# Patient Record
Sex: Male | Born: 1965 | Race: White | Hispanic: No | Marital: Married | Smoking: Current every day smoker
Health system: Southern US, Community
[De-identification: ages and names within clinical notes are randomized; demographics above are authoritative.]

## PROBLEM LIST (undated history)

## (undated) DIAGNOSIS — I10 Essential (primary) hypertension: Secondary | ICD-10-CM

## (undated) DIAGNOSIS — F1010 Alcohol abuse, uncomplicated: Secondary | ICD-10-CM

---

## 2002-04-10 ENCOUNTER — Encounter: Payer: Self-pay | Admitting: Family Medicine

## 2002-04-10 ENCOUNTER — Ambulatory Visit (HOSPITAL_COMMUNITY): Admission: RE | Admit: 2002-04-10 | Discharge: 2002-04-10 | Payer: Self-pay | Admitting: Family Medicine

## 2004-05-05 ENCOUNTER — Ambulatory Visit (HOSPITAL_COMMUNITY): Admission: RE | Admit: 2004-05-05 | Discharge: 2004-05-05 | Payer: Self-pay | Admitting: Internal Medicine

## 2005-11-28 IMAGING — CT CT PELVIS W/ CM
1 of 3 series · 12 of 32 positions shown, 18 images · IV contrast (agent unspecified)
Comparison: None.

CLINICAL DATA: Hematuria, abdominal pain and nausea. 
 ABDOMEN AND PELVIS CT WITH CONTRAST ? 05/05/04:
TECHNIQUE: Contiguous 5 mm axial images were obtained from the lung bases through the pubic symphysis after administration of oral and 022cc of nonionic intravenous contrast.

[Series 3377: — · axial · 0.80mm/px · z∈[+1348,+1768]mm · 12 of 100 slices shown, 18 images]
[im 8/100  soft-tissue]
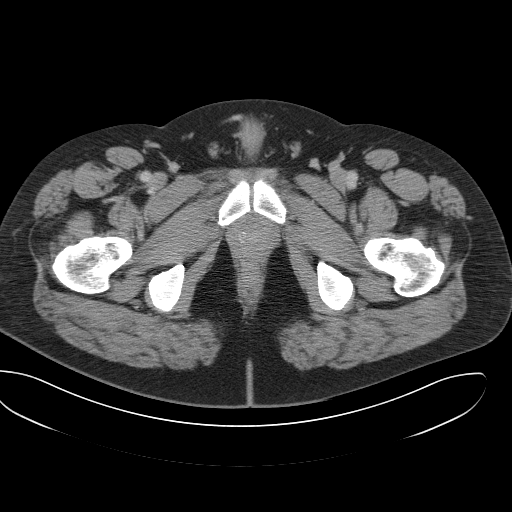
[im 8/100  bone]
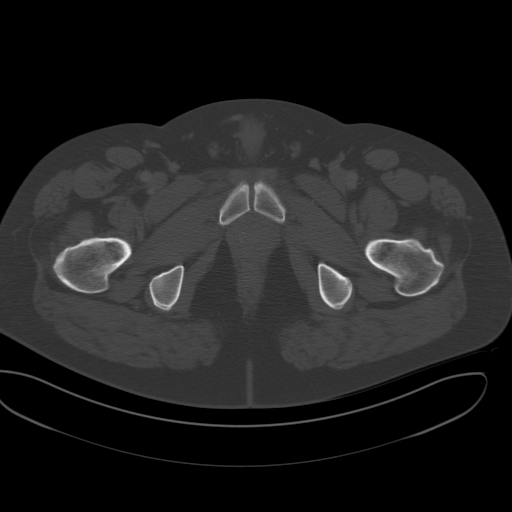
[im 16/100  soft-tissue]
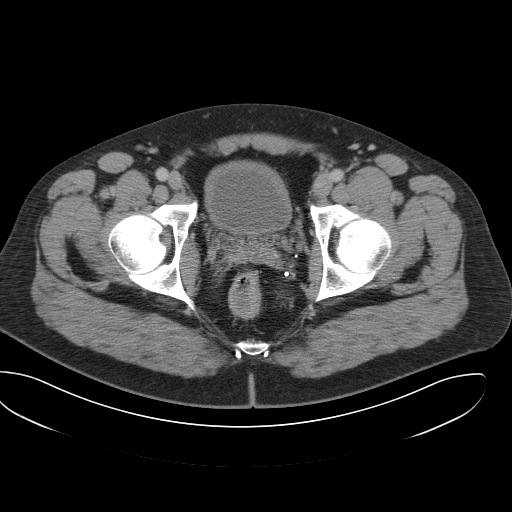
[im 23/100  soft-tissue]
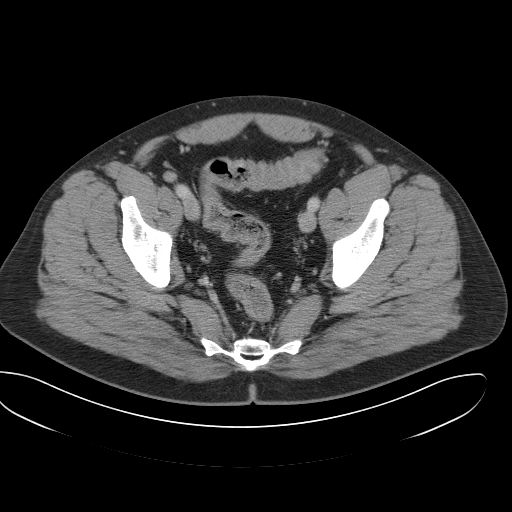
[im 31/100  soft-tissue]
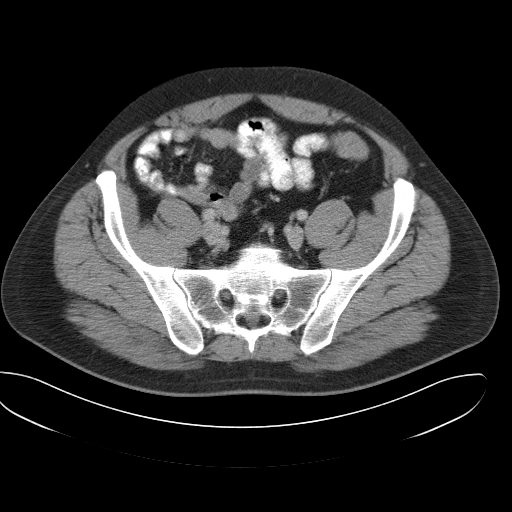
[im 39/100  soft-tissue]
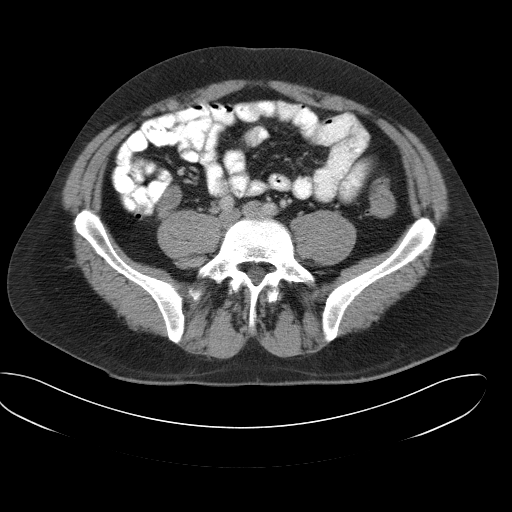
[im 46/100  soft-tissue]
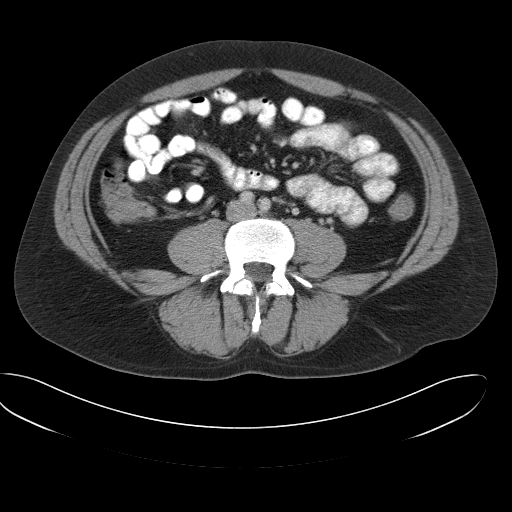
[im 54/100  soft-tissue]
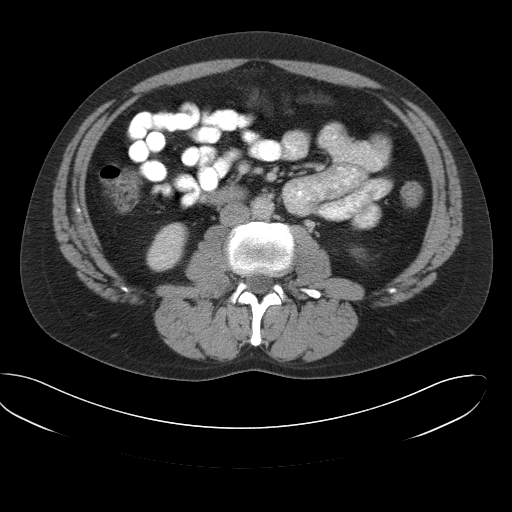
[im 61/100  soft-tissue]
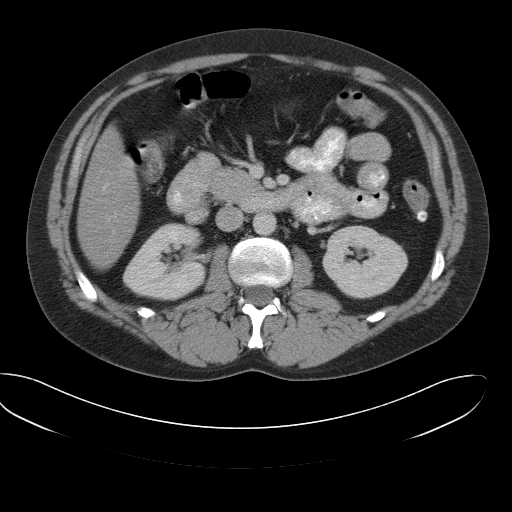
[im 69/100  soft-tissue]
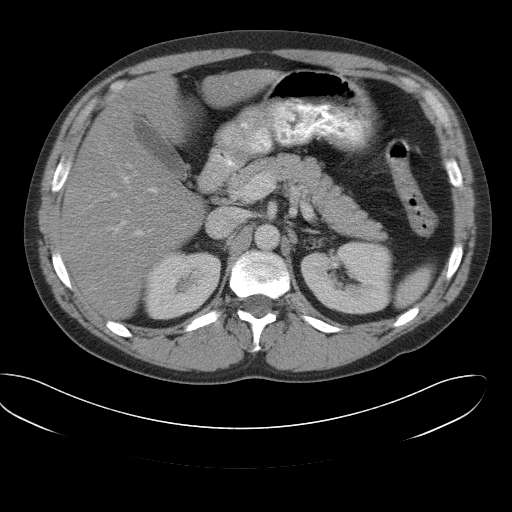
[im 69/100  lung]
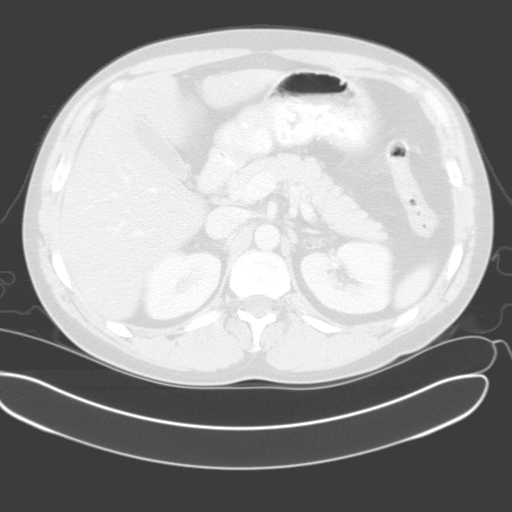
[im 69/100  bone]
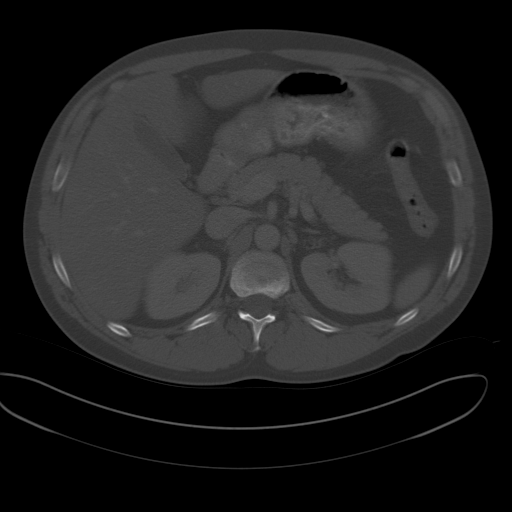
[im 77/100  soft-tissue]
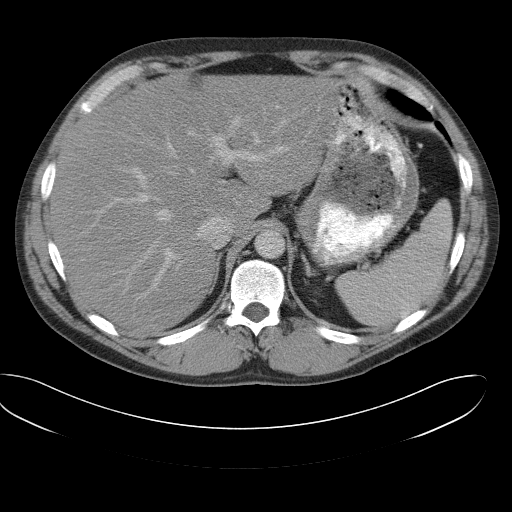
[im 77/100  lung]
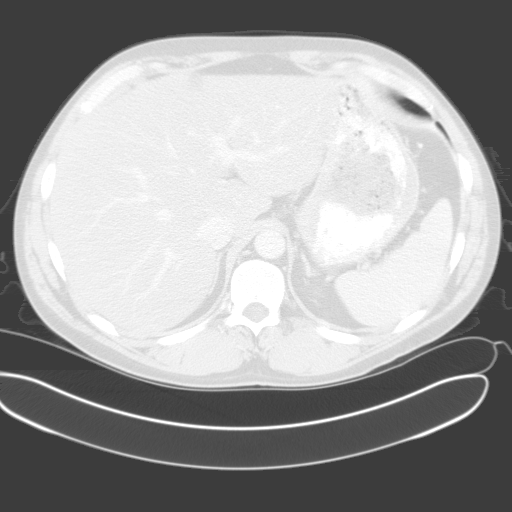
[im 84/100  soft-tissue]
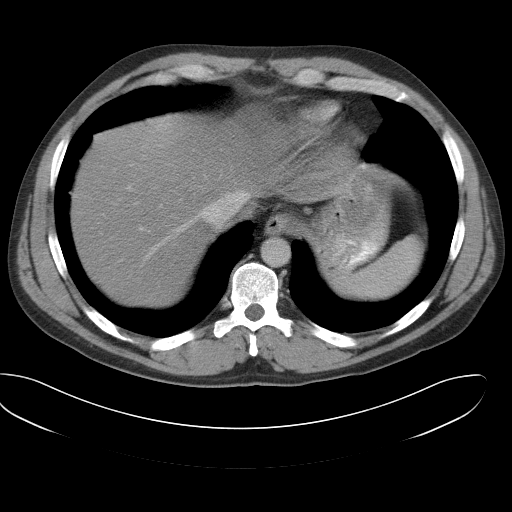
[im 84/100  lung]
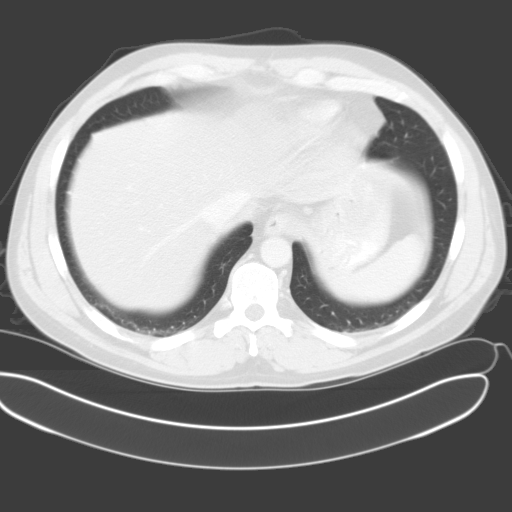
[im 92/100  soft-tissue]
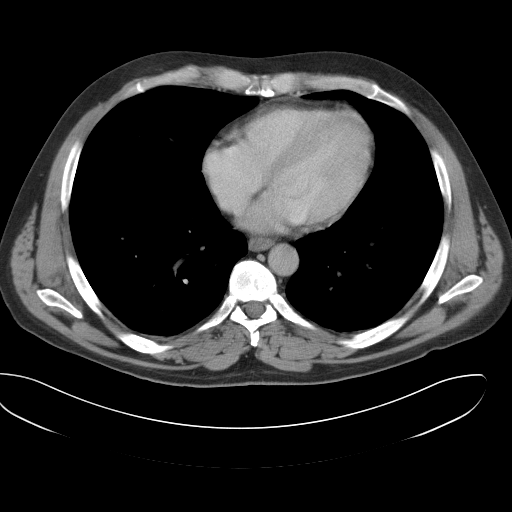
[im 92/100  lung]
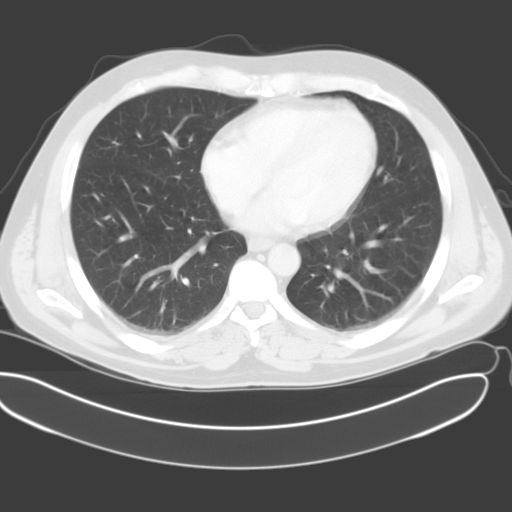

[12 of 32 positions shown; findings below may reference images not displayed]

FINDINGS: The liver is diffusely infiltrated.  The spleen, stomach, duodenum, pancreas, and adrenal glands are unremarkable.  The gallbladder is decompressed.   There are subcentimeter well defined low-density lesions in each kidney, most consistent with cysts.  A 3mm non-obstructing stone is seen in the lower pole of the left kidney. There is no lymphadenopathy or free fluid.
IMPRESSION: Diffuse fatty infiltration of the liver. 
 Tiny bilateral low-density renal lesions most compatible with cysts. 
 3mm non-obstructing lower pole stone in the left kidney. 
 CT PELVIS, WITH CONTRAST:
 Imaging through the anatomic pelvis reveals a normal appendix.  The terminal ileum is unremarkable.  No free fluid or adenopathy.  Phleboliths are seen in the anatomic pelvis bilaterally, but project clear of the distal ureters.  Diverticular change is seen in the sigmoid colon and wall thickening in the sigmoid colon is presumably related to musculature hypertrophy.  There is no pericolonic inflammation to suggest diverticulitis.
IMPRESSION: Normal appendix and terminal ileum. 
 No evidence for distal ureteral stones. 
 Diverticular change in the sigmoid colon without evidence for diverticulitis.

## 2006-01-02 ENCOUNTER — Inpatient Hospital Stay (HOSPITAL_COMMUNITY): Admission: EM | Admit: 2006-01-02 | Discharge: 2006-01-04 | Payer: Self-pay | Admitting: Emergency Medicine

## 2007-06-02 ENCOUNTER — Inpatient Hospital Stay (HOSPITAL_COMMUNITY): Admission: EM | Admit: 2007-06-02 | Discharge: 2007-06-04 | Payer: Self-pay | Admitting: Emergency Medicine

## 2007-08-05 ENCOUNTER — Emergency Department (HOSPITAL_COMMUNITY): Admission: EM | Admit: 2007-08-05 | Discharge: 2007-08-05 | Payer: Self-pay | Admitting: Emergency Medicine

## 2008-12-25 IMAGING — CR DG CHEST 2V
2 series · 2 of 2 positions shown · non-contrast
Comparison: none

CLINICAL DATA: Renal failure.  Hyponatremia.  Question infiltrate.
 CHEST ? 2 VIEW ? 06/02/07:

[w chest pa]
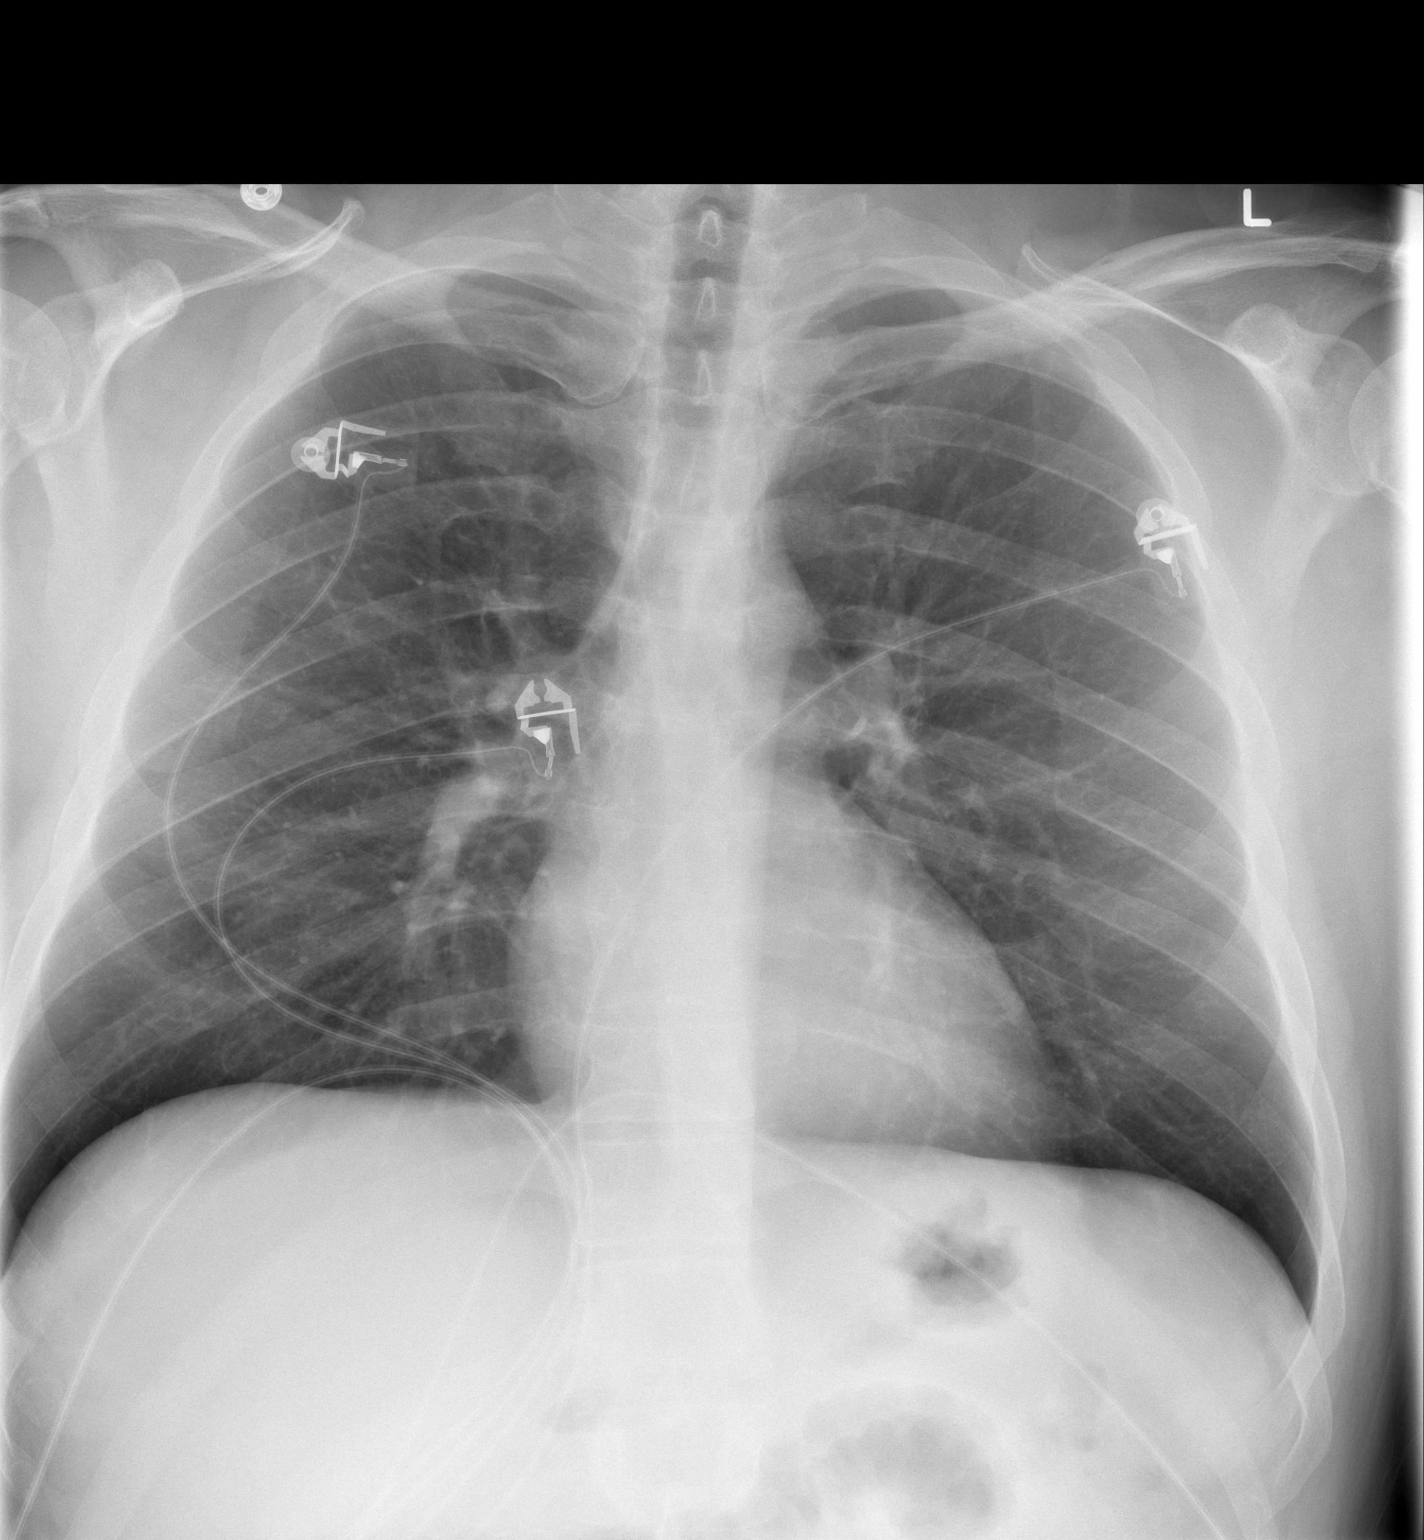

[w chest lat]
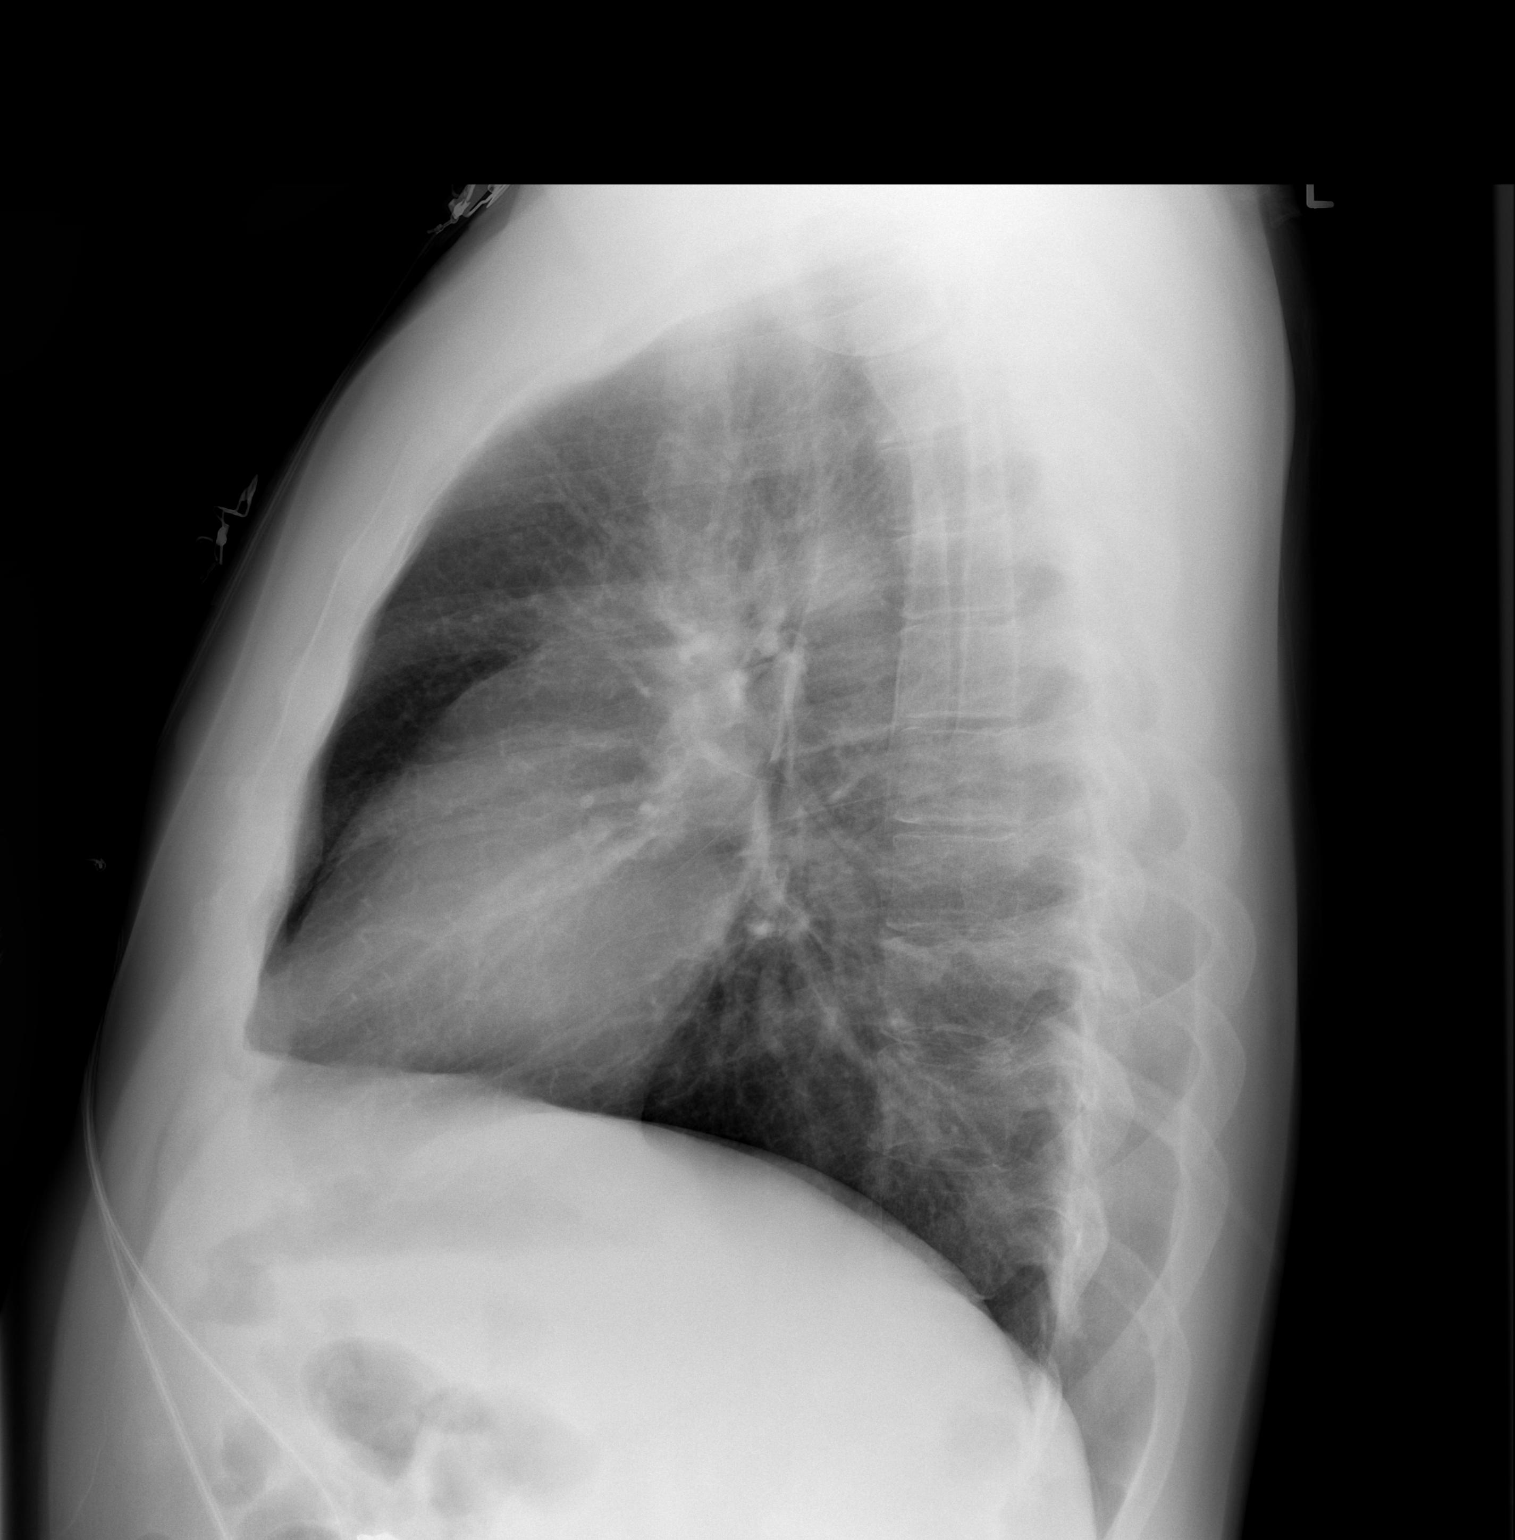

[2 of 2 positions shown; findings below may reference images not displayed]

FINDINGS: The lungs are clear and the heart and mediastinal structures are normal.
IMPRESSION: No evidence for active chest disease.

## 2010-06-17 ENCOUNTER — Emergency Department (HOSPITAL_COMMUNITY)
Admission: EM | Admit: 2010-06-17 | Discharge: 2010-06-17 | Payer: Self-pay | Source: Home / Self Care | Admitting: Family Medicine

## 2010-06-25 ENCOUNTER — Encounter: Payer: Self-pay | Admitting: Family Medicine

## 2010-10-17 NOTE — Consult Note (Signed)
NAME:  EGAN, BERKHEIMER          ACCOUNT NO.:  [id number]   MEDICAL RECORD NO.:  [id number]          PATIENT TYPE:  INP   LOCATION:  4741                         FACILITY:  [facility]   PHYSICIAN:  Antonietta Breach, M.D.  DATE OF BIRTH:  1965-08-08   DATE OF CONSULTATION:  06/03/2007  DATE OF DISCHARGE:                                 CONSULTATION   REQUESTING PHYSICIAN:  [facility]   REASON FOR CONSULTATION:  Alcohol dependence.   HISTORY OF PRESENT ILLNESS:  [name] is a 45 year old  male admitted to the [facility] on June 02, 2007 with  hyponatremia.   [name] describes economic stress.  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  He does have a great deal of  worry as well as feeling on edge.  He consumes a large amount of alcohol  per day.  He estimates that he drinks as much as one-fifth of liquor per  day with a number of beers on top of that and has been doing this for  several weeks.   He denies depressed mood or decreased interests.  He does have  constructive future goals.  He has no thoughts of harming himself or  others.  He has no hallucinations or delusions.  He has had some  difficulty with memory and concentration at times however this has  improved after his general medical support in the hospital.  He is  tolerating the Ativan withdrawal protocol without adverse effects.  He  has no hallucinations or delusions.  He is currently not having any  tremors, sweats or other withdrawal signs.  He is socially appropriate  and cooperative with his bedside healthcare.   PAST PSYCHIATRIC HISTORY:  No history of suicide attempts.  No history  of major depression.  He has no history of formal alcohol  rehabilitation.  He has attended two AA meetings.   He does have a history of a DWI when he was in high school.   He has not been on any psychotropic medication other than his  detoxification.   FAMILY PSYCHIATRIC  HISTORY:  None known.   SOCIAL HISTORY:  [name].  He  has no children.  He does not do any illegal drugs.  Please see the  history of present illness.  He lives with his wife.   PAST MEDICAL HISTORY:  1. Hypertension.  2. Hypothyroidism.  3. History of renal insufficiency.   ALLERGIES:  Codeine.   MEDICATIONS:  MAR is reviewed.  The patient is on the Ativan withdrawal  protocol.   LABORATORY DATA:  Sodium 131, BUN 30, creatinine 1.83, WBC 5.1,  hemoglobin 15.3, platelet count 135, SGOT 157, SGPT 141. Magnesium,  phosphorus and TSH are within normal limits.  Urine drug screen positive  for benzodiazepines.   REVIEW OF SYSTEMS:  CONSTITUTIONAL:  Afebrile.  No weight loss.  HEAD:  No trauma.  EYES:  No visual changes.  EARS:  No hearing impairment.  NOSE:  No rhinorrhea.  THROAT:  No sore throat.  NEUROLOGIC:  No focal  motor  or sensory deficit.  PSYCHIATRIC:  As above.  CARDIOVASCULAR:  No  chest pain or palpitations.  RESPIRATORY:  No coughing or wheezing.  GASTROINTESTINAL:  No nausea, vomiting, diarrhea.  GENITOURINARY:  No  dysuria.  SKIN:  Unremarkable.  ENDOCRINE/METABOLIC:  No heat or cold  intolerance.  MUSCULOSKELETAL:  No deformities.  HEMATOLOGIC/LYMPHATIC:  Unremarkable.   PHYSICAL EXAMINATION:  VITAL SIGNS:  Temperature 97.1, pulse 96,  respiratory rate 20, blood pressure 114/69, O2 saturation on room air  98%.  GENERAL APPEARANCE:  [name] is a middle-aged male sitting up in  a hospital chair with no abnormal involuntary movements.  MENTAL STATUS EXAM:  [name] is alert.  His attention span is  slightly decreased.  His eye contact is good.  He is socially  appropriate and cooperative.  He is oriented to all spheres.  Memory is  intact to immediate, recent and remote.  His affect is slightly anxious.  Mood is mildly anxious.  Fund of knowledge and intelligence within  normal limits.  Speech normal rate and  prosody without dysarthria.  Thought process logical, coherent, goal-directed.  No looseness of  associations.  Language expression and comprehension intact.  Abstraction intact.  Thought content:  No thoughts of harming himself or  others.  No delusions or hallucinations.  Insight is partial.  Judgment  is intact.   ASSESSMENT:  AXIS I:  1.  293.84  Anxiety disorder not otherwise  specified.  1. Alcohol dependence with physiologic dependence.  AXIS II:  Deferred.  AXIS III:  See general medical section.  AXIS IV:  General medical economic occupational.  AXIS V:  55.   John Gay is not at risk to harm himself or others.  He agrees to  call emergency services immediately for any psychiatric emergency  symptoms.   The undersigned provided ego supportive psychotherapy and education.   The patient is motivated for an outpatient alcohol rehabilitation  program.  He emphasizes that he must continue working while he is  attending the program.   RECOMMENDATIONS:  1. Would have the social worker help coordinate with the patient      starting a chemical dependency intensive outpatient program at one      of the hospitals.  Options are [facility], [facility] or      [facility]. The [facility]'s meet at various times which could      be coordinated with the patient's work schedule.  2. 12-step groups.  3. Would continue thiamine, a multivitamin and folate every day      indefinitely.  4. Regarding treatment for anxiety, this will be deferred.  The      patient will benefit from coping skills and stress management      training.  Whether or not he will need SSRI therapy will be      determined in [facility] after he is finished with the [facility] detox      protocol.  He will likely be a candidate eventually for cognitive      behavioral therapy along with deep breathing and progressive muscle      relaxation training.   Once the [facility] IOP is arranged would ask the patient to  release a copy of  this consultation to the mental health professionals in the [facility] IOP.      Antonietta Breach, M.D.  Electronically Signed     JW/MEDQ  D:  06/04/2007  T:  06/04/2007  Job:  [id number]

## 2010-10-17 NOTE — H&P (Signed)
NAME:  John Gay, CAHOON          ACCOUNT NO.:  [id number]   MEDICAL RECORD NO.:  [id number]          PATIENT TYPE:  EMS   LOCATION:  MINO                         FACILITY:  [facility]   PHYSICIAN:  Hettie Holstein, D.O.    DATE OF BIRTH:  21-Apr-1966   DATE OF ADMISSION:  06/02/2007  DATE OF DISCHARGE:                              HISTORY & PHYSICAL   PRIMARY CARE PHYSICIAN:  Dr. Lorenz Coaster as well as Dr. Regino Schultze in  [facility], and Dr. Nobie Putnam.   CHIEF COMPLAINT:  Generalized feeling unwell.   HISTORY OF PRESENT ILLNESS:  Mr. Proto is a pleasant 45 year old  male with known alcoholism who attempted to stop drinking yesterday.  He  had been feeling bad for the past week or so.  His wife states that he  typically does very poorly when he tries to stop drinking and typically  will drink a shot to ease the tremors and shakes.  He presents to the  emergency department tremulous.  His alcohol level is 10.  His last  drink was the day prior to presentation.  His wife brought him to the  emergency department.  Again, his last drink was at 9:30 p.m.  He is  having quite poor oral intake and his creatinine is elevated at 3 in the  emergency department.   PAST MEDICAL HISTORY:  Significant for:  1. Previous history of DTs.  2. History of alcoholism.  3. Tobacco dependence.  4. Hypertension.  5. Hypothyroidism.  6. Anxiety.  7. Reported elevation of LFTs.  Details are not further available at      this time.   HOME MEDICATIONS:  1. Synthroid 200 mcg daily.  2. Lisinopril/hydrochlorothiazide 10/12.5.  3. Toprol XL 25 mg daily.   ALLERGIES:  CODEINE, reaction unknown.   SOCIAL HISTORY:  The patient drinks about a fifth of whiskey plus 3-4  beers per day.  He works as a [employer] manager.  He has no children.  He has been  married almost 16 years.  His mother is present with him at this time,  phone number is [phone].   FAMILY HISTORY:  Mother has a history of deep vein thrombosis and no  other family history is provided.   REVIEW OF SYSTEMS:  He has been having some nausea.  No vomiting, no  chest pain or shortness of breath.  He has been having generalized  tremors.  He has not passed out according to the family as they can  recall.  His wife repots that he does have a tremendous amount of  anxiety with the recent loss of a family member as well as problems with  their animals at home.  In any event, his wife states that very little  is required to cause him to seek alcohol for self treatment.   PHYSICAL EXAMINATION:  VITAL SIGNS:  In the emergency department, his  blood pressure is 101/66, temperature 97.3, heart rate 97, respirations  18, O2 saturation is 99%.  HEENT:  Reveals head to be normocephalic atraumatic.  Extraocular  muscles are intact.  NECK:  Supple and nontender without palpable thyromegaly or mass.  CARDIOVASCULAR:  Reveals a normal S1 and S2.  LUNGS:  Clear bilaterally without dullness to percussion.  He exhibits  normal effort.  ABDOMEN:  Soft and nontender without rebound or guarding.  No rigidity.  No palpable hepatosplenomegaly.  Bowel sounds are normoactive.  LOWER EXTREMITIES:  Reveals no calf tenderness or edema.   LABORATORY DATA:  His WBC is 5.1, hemoglobin 15.3, platelet count 135,  MCV of 98.  Sodium 119, potassium 4.5, BUN 36, creatinine 3, and glucose  of 88.  No imaging studies are available at this time as our imaging  system is currently not accessible.   ASSESSMENT:  1. Alcohol withdrawal.  2. Delirium tremens.  3. Hyponatremia secondary to acute renal insufficiency, volume      depletion, and continued Lisinopril and hydrochlorothiazide.  4. Acute renal failure likely prerenal in addition to continued      antihypertensive medications.  5. Tobacco abuse.  6. Prior history of elevated liver function tests, we do not have this      for review at this time.  7. Hypothyroidism.   PLAN:  At this time Mr. Kohlmann will be  admitted to a telemetry  floor, placed on DT precautions and benzodiazepines and alcohol  withdrawal protocol.  Will follow his clinical course and resolution of  his renal failure and electrolytes abnormalities.  He does express a  desire to quit drinking and he does seem to have quite a strong support  from his mother as well as his wife.      Hettie Holstein, D.O.  Electronically Signed     ESS/MEDQ  D:  06/02/2007  T:  06/02/2007  Job:  [id number]   cc:   Patrica Duel, M.D.

## 2010-10-17 NOTE — Discharge Summary (Signed)
NAME:  John Gay, John Gay          ACCOUNT NO.:  [id number]   MEDICAL RECORD NO.:  [id number]          PATIENT TYPE:  INP   LOCATION:  4741                         FACILITY:  [facility]   PHYSICIAN:  Elliot Cousin, M.D.    DATE OF BIRTH:  11/18/1965   DATE OF ADMISSION:  06/02/2007  DATE OF DISCHARGE:                               DISCHARGE SUMMARY   PRIMARY CARE PHYSICIAN:  Dr. Patrica Duel.   DISCHARGE DIAGNOSES:  1. Alcohol dependence/addiction  2. Alcohol withdrawal syndrome.  3. Acute renal failure secondary to prerenal azotemia.  Resolved prior      to hospital discharge.  4. Hepatic transaminitis/alcoholic hepatitis.  5. Hypothyroidism.  6. Hypertension  7. Tobacco abuse.   DISCHARGE MEDICATIONS:  1. Multivitamin once daily.  2. Toprol XL 50 mg daily.  3. Synthroid 200 mcg daily.  4. Xanax 0.5 mg 1/2 tablet to 1 tablet every 8 hours p.r.n.  5. Do not take lisinopril/HCTZ until you see your doctor next week.   CONSULTATIONS:  Psychiatrist, Antonietta Breach, MD.   PROCEDURE PERFORMED:  None.   HISTORY OF PRESENT ILLNESS:  The patient is a 45 year old man with a  past medical history significant for alcohol abuse, hypothyroidism,  chronic anxiety, and hypertension.  He presented to the emergency  department on June 02, 2007, with a chief complaint of not feeling  well, nausea, and tremor.  The patient desired detoxification from  alcohol.  During the evaluation in the emergency department, his alcohol  level was 10 and his urine drug screen was positive for benzodiazepines.  The patient was admitted for further evaluation and management.   For additional details, please see the history and physical.   HOSPITAL COURSE:  1. ALCOHOL ABUSE/DEPENDENCE AND ALCOHOL WITHDRAWAL SYNDROME.  The      patient was started on the Ativan withdrawal protocol.  In      addition, thiamine, folate, and a multivitamin were added as      empiric treatment.  Over the course of the  hospitalization, the      patient's withdrawal symptoms abated.  Dr. Jeanie Sewer, psychiatrist,      was consulted for further evaluation and recommendations.  Per Dr.      Providence Crosby assessment, the patient had alcohol dependence with a      physiologic dependence.  He recommended coordinating the patient      with a chemical dependency intensive outpatient program at one of      the local hospitals.  The social worker has been consulted and will      provide the patient with the outpatient substance abuse programs in      the area.  The patient was receptive to outpatient treatment.      Today, he has no signs or symptoms of alcohol withdrawal.  He is      currently stable for hospital discharge.  The patient was advised      to surround himself with supportive family members and/or friends      given that this is New Years Eve.  His brother is currently in his      room  and is supportive.  The patient was advised to continue a      multivitamin once daily.  2. HYPOTHYROIDISM.  The patient was maintained on Synthroid 200 mcg      daily during hospital course.  His TSH was within normal limits at      1.3.  3. ACUTE RENAL INSUFFICIENCY.  At the time of the initial hospital      evaluation, the patient's creatinine was 3 and his BUN was 36.  The      patient was started on volume repletion with normal saline.  Over      the course of the hospitalization, his BUN improved to 17 and his      creatinine improved to 1.13.  Of note, the lisinopril/HCTZ was      discontinued during the hospital course.  4. HYPERTENSION.  The patient's blood pressure was well controlled on      metoprolol 25 mg b.i.d.  As indicated above, lisinopril/HCTZ was      not given during the hospital course.  The patient was advised to      resume Toprol XL 50 mg daily following hospital discharge.  5. ALCOHOLIC HEPATITIS.  The patient's liver transaminases were      elevated during the hospital course.  At the time of  the initial      hospital evaluation, his total bilirubin was 2.1, SGOT was 157, and      SGPT was 141.  A followup evaluation of his liver transaminases was      not ordered.  More than likely, the hepatic transaminitis was      secondary to alcoholic hepatitis.  The patient will need to have      followup evaluation of his liver transaminases.   DISCHARGE DISPOSITION:  The patient was discharged to home in improved  and stable condition on June 04, 2007.  The patient was advised to  follow up with his primary care physician, Dr. Nobie Putnam, in 1 week.  During the hospital followup evaluation, a hepatic panel and basic  metabolic panel will need to be re-assessed.      Elliot Cousin, M.D.  Electronically Signed     DF/MEDQ  D:  06/04/2007  T:  06/04/2007  Job:  [id number]   cc:   Patrica Duel, M.D.

## 2010-10-20 NOTE — Discharge Summary (Signed)
NAME:  John Gay, John Gay          ACCOUNT NO.:  [id number]   MEDICAL RECORD NO.:  [id number]          PATIENT TYPE:  INP   LOCATION:  A201                          FACILITY:  [facility]   PHYSICIAN:  Patrica Duel, M.D.    DATE OF BIRTH:  01-Jul-1965   DATE OF ADMISSION:  01/02/2006  DATE OF DISCHARGE:  08/03/2007LH                                 DISCHARGE SUMMARY   DISCHARGE DIAGNOSES:  1. Renal failure secondary to severe dehydration with a component of ACE      inhibitor effect.  2. Hypertension.  3. Hypothyroidism, compensated.   For details regarding admission, please refer to the admitting note.  Briefly, this 45 year old male with a history as noted above developed  increasingly severe weakness, muscular cramping and orthostatic symptoms.  He had been working quite hard in the extreme heat and also using some  alcohol and taking his blood pressure med as directed.   In the emergency department, the patient was found to be hypotensive.  His  creatinine was 6.1 with a BUN of 28, these numbers were repeated and  consistent.  His electrolytes were relatively normal with the potassium  mildly depressed at 3.1, calcium of 10, acid base normal.   In the emergency department, the patient was aggressively hydrated.  He was  admitted for obvious dehydration, renal failure with a prerenal component.  The possibility of rhabdomyolysis was excluded as the CPK was only 300.   COURSE IN THE HOSPITAL:  The patient was hydrated aggressively.  Nearly  immediately felt much better.  His laboratory returned to normal.  Dr.  Kristian Covey saw the patient in consultation who agreed with the management.  He  was discharged in stable condition on Synthroid 0.2 mg daily, he is to hold  his ACE inhibitor and diuretic pending his progress.  Will follow and treat  expectantly.      Patrica Duel, M.D.  Electronically Signed     MC/MEDQ  D:  01/14/2006  T:  01/14/2006  Job:  [id number]

## 2010-10-20 NOTE — H&P (Signed)
NAME:  John Gay, John Gay          ACCOUNT NO.:  [id number]   MEDICAL RECORD NO.:  [id number]           PATIENT TYPE:  INP   LOCATION:  A201                          FACILITY:  [facility]   PHYSICIAN:  Patrica Duel, M.D.    DATE OF BIRTH:  [birth year]   DATE OF ADMISSION:  DATE OF DISCHARGE:  LH                                HISTORY & PHYSICAL   CHIEF COMPLAINT:  Dizziness and weakness.   HISTORY OF PRESENT ILLNESS:  This is a 45 year old male with a history of  compensated hypothyroidism and hypertension.  Meds are noted below.  He has  an, otherwise, benign medical history.  The patient presented to the  emergency department with a 36 hour history of increasingly severe  generalized weakness, diffuse muscular cramping, and orthostatic symptoms.  In the emergency department, he was found to be hypertensive with a systolic  blood pressure approximately 70 with a heart rate of 110.  Lab work was  obtained which revealed a creatinine of 6.1, BUN of 28, potassium 3.1,  calcium 10.  His creatinine was repeated due to the relative inconsistency  of the BUN to creatinine ratio and his creatinine was, indeed, 7.1.  Total  CPK 310.  In the interim, the patient was given a liter of lactated ringers  and then several normal saline boluses.  Currently, the patient is much  improved with systolic blood pressure of 112/70, heart rate 78, and he is  feeling much better.  The patient has experienced some nausea without  vomiting and had several loose stools.  He denies any melena, hematemesis,  hematochezia, or abdominal pain.  He has urinated several times today,  though he has noted it to be somewhat dark in color.   There is no history of headache, neurologic deficits, chest pain, shortness  of breath.  He also denies any gross hematuria or symptoms compatible with  prostatism or urinary retention.  The patient is admitted with apparent  severe dehydration and elevated creatinine probably on the  basis of severe  dehydration in combination with the meds noted below.   CURRENT MEDICATIONS:  Include Lisinopril HCT 10/12.5 and Synthroid 0.2 mg  daily.   PAST MEDICAL HISTORY:  As noted.   FAMILY HISTORY:  Noncontributory.   REVIEW OF SYSTEMS:  As mentioned.   SOCIAL HISTORY:  The patient drinks 2-6 drinks nightly and smokes.   PHYSICAL EXAMINATION:  GENERAL:  This is a very pleasant fully alert male who is currently in no  distress.  VITAL SIGNS:  As noted.  HEENT:  Normocephalic, atraumatic, there is no scleral icterus, ears, nose  and throat are benign.  Mucous membranes are dehydrated but not severely.  NECK:  Supple, no masses, thyromegaly, lymphadenopathy, or bruits.  LUNGS:  Clear.  HEART:  Sounds are normal without murmurs, gallops, and rubs.  ABDOMEN:  Nontender, nondistended, bowel sounds intact.  EXTREMITIES:  No cyanosis, clubbing, and edema.   ASSESSMENT:  Renal failure secondary to severe dehydration, rhabdomyolysis  does not appear to be a factor at this time.  ACE inhibitor, diuretic  therapy, and significant alcohol intake as  well as a job in which he is  exposed to high heat and one which requires very physical exertion has all  contributed to this problem.   PLAN:  Aggressive rehydration.  Will discontinue the ACE inhibitor.  Continue Synthroid at the current dose.  We will repeat his laboratory at  frequent intervals.  We will follow and treat expectantly.  Dr. Kristian Covey  will assist in this case.      Patrica Duel, M.D.  Electronically Signed     MC/MEDQ  D:  01/02/2006  T:  01/02/2006  Job:  [id number]

## 2010-10-20 NOTE — Consult Note (Signed)
NAME:  John Gay, John Gay          ACCOUNT NO.:  [id number]   MEDICAL RECORD NO.:  [id number]          PATIENT TYPE:  INP   LOCATION:  A201                          FACILITY:  [facility]   PHYSICIAN:  Jorja Loa, M.D.DATE OF BIRTH:  Nov 21, 1965   DATE OF CONSULTATION:  DATE OF DISCHARGE:                                   CONSULTATION   REASON FOR CONSULTATION:  Renal insufficiency.   John Gay is a 45 year old gentleman with past medical history of  diabetes with history of hypertension and hypothyroidism presently came with  the complaints of feeling dizzy and fatigued with some cramps.  According to  the patient he was working outside the last couple of days, but yesterday he  felt dizzy like falling down and he started becoming fatigued followed by  leg, hand and shoulder cramp hence he decided to come to the emergency room.  In the emergency room, the patient was found to have severe hypotension with  systolic blood pressure of 71 and elevated creatinine of 7 hence admitted  for severe hypotension and __________ .  According to John Gay he has  a history of a kidney stone.  He was told about this a couple of years ago,  otherwise he denies any history of kidney problems.  The patient also has a  history of diarrhea but no nausea, no vomiting, no __________ .   PAST MEDICAL HISTORY:  As stated above.  Patient with history of kidney  stone, history of hypothyroidism, history of hypertension.   MEDICATIONS:  Includes lisinopril, hydrochlorothiazide and also he was on  Synthroid.  Presently, after admission, he is on Synthroid 200 mcg p.o. q.d.  He is getting IV fluids and normal saline with __________  at 125 cc per  hour.  Other medications are p.r.n. medication.   ALLERGIES:  CODEINE.   SOCIAL HISTORY:  Has a history of smoking but no history of alcohol  __________ .   REVIEW OF SYSTEMS:  Presently feels better.  He denies any dizziness,  lightheadedness, no  shortness of breath, no hesitancy or frequency.  His  appetite is reasonable.  He does not have also any diarrhea.  He does not  have any swelling of his legs.   PHYSICAL EXAMINATION:  VITAL SIGNS:  Temperature is 98.1.  Blood pressure is  113/69.  Heart rate is 80.  CHEST:  Clear to auscultation.  CARDIAC EXAM:  Regular rate and rhythm, no murmur.  ABDOMEN:  Soft, positive bowel sounds.  EXTREMITIES:  No edema.   He has urine output but is not documented.  He showed me that he had some  urine in the urine out.  His blood work when he came - ph of 7.43, PC02 of  18.  The initial blood work when he came was his creatinine was between 6.8  and repeat 7.1, however, his BUN was low at 27, and his potassium was 3.1.  Presently he has another blood work which was done afterwards which showed  his sodium to be 132, potassium 3.2, C02 of 19, BUN 28, creatinine is 4.9,  bilirubin is 1.4, SGOT is  39, SGPT is 59, calcium 9.2 and CPK was 312.  Urine specific gravity was 1.03.  He has some large blood, protein 100 and  he has some urine casts.  Repeat this morning his blood has gone down to  moderate and protein negative.  He did not have an ultrasound.  He had CT  scan about 2 years ago which showed some small 3-mm stone.   ASSESSMENT:  1.  Renal insufficiency, acute.  Presently his BUN and creatinine seem to be      improving.  His creatinine surprisingly was very high as compared to      BUN.  Since the patient has high urine specific gravity with hypotension      this morning probably a combination of prerenal syndrome and ATN seems      to be the most likely etiology, as the patient has been also on      __________  and hydrochlorothiazide.  However, his creatinine has been      high as compared to the BUN, the possibly of __________  John Gay was      entertained but __________  was low.  Hence at this moment the low BUN      maybe from poor p.o. intake.  2.  Hypotension from severe  dehydration.  Blood pressure has improved.  3.  Hypocalcemia.  This also could be secondary to hydrochlorothiazide.  He      is on potassium; the last potassium was 3.2.  4.  History of hypothyroidism.  He is on Synthroid.  5.  Hypertension.  BP is stable.  He is not on any antihypertensive      medication.   RECOMMENDATIONS:  __________  this be made today and will follow.  Will  continue with hydration with ultrasound of the kidneys, and once his blood  pressure if it goes up __________  to consider starting 1 antihypertensive  medication.      Jorja Loa, M.D.  Electronically Signed     BB/MEDQ  D:  01/03/2006  T:  01/03/2006  Job:  [id number]

## 2011-02-26 LAB — I-STAT 8, (EC8 V) (CONVERTED LAB)
BUN: 3 — ABNORMAL LOW
Chloride: 106
Glucose, Bld: 127 — ABNORMAL HIGH
pCO2, Ven: 44.6 — ABNORMAL LOW
pH, Ven: 7.39 — ABNORMAL HIGH

## 2011-02-26 LAB — DIFFERENTIAL
Basophils Relative: 0
Eosinophils Absolute: 0.2
Eosinophils Relative: 4
Lymphs Abs: 1.7
Monocytes Relative: 11
Neutrophils Relative %: 55

## 2011-02-26 LAB — CBC
HCT: 47.5
MCHC: 34.3
MCV: 95.5
Platelets: 148 — ABNORMAL LOW
RBC: 4.98

## 2011-02-26 LAB — URINALYSIS, ROUTINE W REFLEX MICROSCOPIC
Glucose, UA: NEGATIVE
Nitrite: NEGATIVE
pH: 7

## 2011-02-26 LAB — RAPID URINE DRUG SCREEN, HOSP PERFORMED
Barbiturates: NOT DETECTED
Benzodiazepines: POSITIVE — AB
Cocaine: NOT DETECTED

## 2011-03-09 LAB — BASIC METABOLIC PANEL
Calcium: 9
Calcium: 9.5
Creatinine, Ser: 1.83 — ABNORMAL HIGH
GFR calc Af Amer: 50 — ABNORMAL LOW
GFR calc Af Amer: >60
GFR calc non Af Amer: >60
Potassium: 3.6
Sodium: 131 — ABNORMAL LOW
Sodium: 134 — ABNORMAL LOW

## 2011-03-09 LAB — LIPID PANEL
Cholesterol: 216 — ABNORMAL HIGH
HDL: 101
LDL Cholesterol: 92
Total CHOL/HDL Ratio: 2.1
Triglycerides: 113
VLDL: 23

## 2011-03-09 LAB — CBC
MCV: 98.3
Platelets: 135 — ABNORMAL LOW
RBC: 4.49
WBC: 5.1

## 2011-03-09 LAB — I-STAT 8, (EC8 V) (CONVERTED LAB)
Acid-base deficit: 1
Chloride: 89 — ABNORMAL LOW
HCT: 48
Operator id: 234501
Potassium: 4.5
Sodium: 119 — CL
TCO2: 26
pH, Ven: 7.355 — ABNORMAL HIGH

## 2011-03-09 LAB — DIFFERENTIAL
Eosinophils Absolute: 0
Lymphocytes Relative: 10 — ABNORMAL LOW
Lymphs Abs: 0.5 — ABNORMAL LOW
Monocytes Relative: 7
Neutro Abs: 4.2
Neutrophils Relative %: 83 — ABNORMAL HIGH

## 2011-03-09 LAB — COMPREHENSIVE METABOLIC PANEL
Albumin: 4.7
Alkaline Phosphatase: 71
BUN: 34 — ABNORMAL HIGH
Creatinine, Ser: 2.23 — ABNORMAL HIGH
Glucose, Bld: 88
Potassium: 4.1
Total Bilirubin: 2.1 — ABNORMAL HIGH
Total Protein: 7.5

## 2011-03-09 LAB — RAPID URINE DRUG SCREEN, HOSP PERFORMED
Amphetamines: NOT DETECTED
Barbiturates: NOT DETECTED
Benzodiazepines: POSITIVE — AB

## 2011-03-09 LAB — POCT I-STAT CREATININE
Creatinine, Ser: 3 — ABNORMAL HIGH
Operator id: 234501

## 2011-03-09 LAB — PHOSPHORUS: Phosphorus: 3.9

## 2011-03-09 LAB — MAGNESIUM: Magnesium: 1.9

## 2011-03-09 LAB — APTT: aPTT: 31

## 2011-03-09 LAB — ETHANOL: Alcohol, Ethyl (B): 10

## 2011-03-09 LAB — PROTIME-INR: Prothrombin Time: 11.5 — ABNORMAL LOW

## 2016-04-27 ENCOUNTER — Emergency Department (HOSPITAL_COMMUNITY)
Admission: EM | Admit: 2016-04-27 | Discharge: 2016-04-28 | Disposition: A | Discharge status: Home / Self Care | Payer: Self-pay | Attending: Emergency Medicine | Admitting: Emergency Medicine

## 2016-04-27 ENCOUNTER — Encounter (HOSPITAL_COMMUNITY): Payer: Self-pay

## 2016-04-27 DIAGNOSIS — F10920 Alcohol use, unspecified with intoxication, uncomplicated: Secondary | ICD-10-CM

## 2016-04-27 DIAGNOSIS — I10 Essential (primary) hypertension: Secondary | ICD-10-CM | POA: Insufficient documentation

## 2016-04-27 DIAGNOSIS — F17200 Nicotine dependence, unspecified, uncomplicated: Secondary | ICD-10-CM | POA: Insufficient documentation

## 2016-04-27 DIAGNOSIS — F10120 Alcohol abuse with intoxication, uncomplicated: Secondary | ICD-10-CM | POA: Insufficient documentation

## 2016-04-27 HISTORY — DX: Essential (primary) hypertension: I10

## 2016-04-27 LAB — CBC WITH DIFFERENTIAL/PLATELET
BASOS PCT: 0 %
Basophils Absolute: 0 10*3/uL (ref 0.0–0.1)
EOS ABS: 0.3 10*3/uL (ref 0.0–0.7)
EOS PCT: 5 %
HCT: 42.7 % (ref 39.0–52.0)
HEMOGLOBIN: 15 g/dL (ref 13.0–17.0)
Lymphocytes Relative: 28 %
Lymphs Abs: 2.1 10*3/uL (ref 0.7–4.0)
MCH: 34.6 pg — AB (ref 26.0–34.0)
MCHC: 35.1 g/dL (ref 30.0–36.0)
MCV: 98.6 fL (ref 78.0–100.0)
MONOS PCT: 10 %
Monocytes Absolute: 0.8 10*3/uL (ref 0.1–1.0)
NEUTROS PCT: 57 %
Neutro Abs: 4.3 10*3/uL (ref 1.7–7.7)
PLATELETS: 219 10*3/uL (ref 150–400)
RBC: 4.33 MIL/uL (ref 4.22–5.81)
RDW: 13.3 % (ref 11.5–15.5)
WBC: 7.5 10*3/uL (ref 4.0–10.5)

## 2016-04-27 LAB — COMPREHENSIVE METABOLIC PANEL
ALK PHOS: 60 U/L (ref 38–126)
ALT: 51 U/L (ref 17–63)
ANION GAP: 9 (ref 5–15)
AST: 70 U/L — ABNORMAL HIGH (ref 15–41)
Albumin: 4.7 g/dL (ref 3.5–5.0)
BILIRUBIN TOTAL: 0.7 mg/dL (ref 0.3–1.2)
BUN: 10 mg/dL (ref 6–20)
CALCIUM: 9.1 mg/dL (ref 8.9–10.3)
CO2: 29 mmol/L (ref 22–32)
CREATININE: 0.83 mg/dL (ref 0.61–1.24)
Chloride: 105 mmol/L (ref 101–111)
GFR calc non Af Amer: >60 mL/min (ref >60)
GLUCOSE: 94 mg/dL (ref 65–99)
Potassium: 3.8 mmol/L (ref 3.5–5.1)
Sodium: 143 mmol/L (ref 135–145)
TOTAL PROTEIN: 7.6 g/dL (ref 6.5–8.1)

## 2016-04-27 LAB — ETHANOL: Alcohol, Ethyl (B): 296 mg/dL — ABNORMAL HIGH (ref <5)

## 2016-04-27 MED ORDER — LISINOPRIL 10 MG PO TABS
20.0000 mg | ORAL_TABLET | Freq: Once | ORAL | Status: AC
  Administered 2016-04-27: 20 mg via ORAL
  Filled 2016-04-27: qty 2

## 2016-04-27 MED ORDER — LISINOPRIL 20 MG PO TABS: 20.0000 mg | ORAL_TABLET | Freq: Every day | ORAL | 0 refills | Status: DC

## 2016-04-27 NOTE — ED Notes (Signed)
Pt got up and started walking out. Asked patient where he was going patient stated "nowhere" but continued to walk toward door, told patient if he left again he could not come back he would have to start ED process again, patient stated "im not going no where, but left out of main ED anyway. Steward DroneBrenda charge nurse stopped patient in hallway and patient back in hallway bed now.

## 2016-04-27 NOTE — ED Triage Notes (Signed)
Etoh x 3 days.   Pt's wife called ems because she couldn't wake the patient, states he was found out in the woods 2 nights ago when she couldn't find him then as well.  Pt states he has just been drinking and denies any attempt at self-harm.

## 2016-04-27 NOTE — ED Notes (Addendum)
Pt ambulatory without difficulty, back and forth to the waiting area to go outside to smoke and get coffee etc.

## 2016-04-27 NOTE — ED Notes (Signed)
Pt came to nurses station to ask if he could go outside to smoke.  This nurse informed the patient that if he left the property to smoke he would have to check back in and start all over.  Pt states he hadn't drank that much today and states his wife called ems because she wants him to get his blood pressure checked.  Pt is walking without difficulty, states he is going outside now and will come back when he finishes smoking, states he will check back in as needed.

## 2016-04-27 NOTE — ED Provider Notes (Signed)
[facility]EM[facility]Y DEPT Provider Note   CSN: [id number] Arrival date & time: 04/27/16  2020  By signing my name below, I, Majel HomerPeyton Lee, attest that this documentation has been prepared under the direction and in the presence of Pricilla LovelessScott Railee Bonillas, MD . Electronically Signed: Majel HomerPeyton Lee, Scribe. 04/27/2016. 9:11 PM.  History   Chief Complaint Chief Complaint  Patient presents with  . Hypertension   The history is provided by the patient. No language interpreter was used.   HPI Comments: John Gay is a 50 y.o. male brought in by EMS to the Emergency Department for an evaluation s/p extended EtOH use for the past 2 days. Pt reports "his wife called the ambulance this evening because he's been drinking." He states he has been on a "drinking binge" for the past 2 days for "no reason." He states his last drink before this binge began was 2 days prior as he was trying to "stay sober for Thanksgiving."   Pt also reports hx of HTN and states he believes his blood pressure "is running high." He notes his BP is usually around 185/120 at baseline but he has not checked it at all today. He states he took Clonidine this morning because "he knew his blood pressure was high;" however, he notes this is the first time he has taken this medication for ~2 months. He denies chest pain, shortness of breath, nausea, vomiting, headache, blurry vision, weakness, SI and HI.   Past Medical History:  Diagnosis Date  . Hypertension    There are no active problems to display for this patient.  History reviewed. No pertinent surgical history.  Home Medications    Prior to Admission medications   Medication Sig Start Date End Date Taking? Authorizing Provider  lisinopril (PRINIVIL,ZESTRIL) 20 MG tablet Take 1 tablet (20 mg total) by mouth daily. 04/27/16   Pricilla LovelessScott Gillie Fleites, MD    Family History No family history on file.  Social History Social History  Substance Use Topics  . Smoking status: Current Every  Day Smoker  . Smokeless tobacco: Never Used  . Alcohol use Yes     Allergies   Patient has no known allergies.   Review of Systems Review of Systems  Eyes: Negative for visual disturbance.  Respiratory: Negative for shortness of breath.   Cardiovascular: Negative for chest pain.  Gastrointestinal: Negative for nausea and vomiting.  Neurological: Negative for weakness and headaches.  Psychiatric/Behavioral: Negative for suicidal ideas.  All other systems reviewed and are negative.  Physical Exam Updated Vital Signs BP (!) 192/104 (BP Location: Left Arm)   Pulse 95   Temp 97.6 F (36.4 C) (Oral)   Resp 18   SpO2 94%   Physical Exam  Constitutional: He is oriented to person, place, and time. He appears well-developed and well-nourished.  Appears intoxicated  HENT:  Head: Normocephalic and atraumatic.  Right Ear: External ear normal.  Left Ear: External ear normal.  Nose: Nose normal.  Eyes: Right eye exhibits no discharge. Left eye exhibits no discharge.  Neck: Neck supple.  Cardiovascular: Normal rate, regular rhythm and normal heart sounds.   Pulmonary/Chest: Effort normal and breath sounds normal.  Abdominal: Soft. There is no tenderness.  Musculoskeletal: He exhibits no edema.  Neurological: He is alert and oriented to person, place, and time.  CN 3-12 grossly intact. 5/5 strength in all 4 extremities. Grossly normal sensation. Normal gait  Skin: Skin is warm and dry.  Nursing note and vitals reviewed.  ED Treatments / Results  Labs (all labs ordered are listed, but only abnormal results are displayed) Labs Reviewed  COMPREHENSIVE METABOLIC PANEL - Abnormal; Notable for the following:       Result Value   AST 70 (*)    All other components within normal limits  ETHANOL - Abnormal; Notable for the following:    Alcohol, Ethyl (B) 296 (*)    All other components within normal limits  CBC WITH DIFFERENTIAL/PLATELET - Abnormal; Notable for the following:     MCH 34.6 (*)    All other components within normal limits    EKG  EKG Interpretation None       Radiology No results found.  Procedures Procedures (including critical care time)  Medications Ordered in ED Medications  lisinopril (PRINIVIL,ZESTRIL) tablet 20 mg (20 mg Oral Given 04/27/16 2235)    DIAGNOSTIC STUDIES:  COORDINATION OF CARE:  9:05 PM Discussed treatment plan with pt at bedside and pt agreed to plan.  Initial Impression / Assessment and Plan / ED Course  I have reviewed the triage vital signs and the nursing notes.  Pertinent labs & imaging results that were available during my care of the patient were reviewed by me and considered in my medical decision making (see chart for details).  Clinical Course as of Apr 27 2333  Caleen EssexFri Apr 27, 2016  2110 Labs, overall appears well  [SG]  2330 Kathryne SharperSheriff will take patient home. D/c with lisinopril, f/u with new PCP next week as scheduled  [SG]    Clinical Course User Index [SG] Pricilla LovelessScott Lenae Wherley, MD    Patient does not appear to have any acute complications of hypertension. Likely has long-standing hypertension. Discussed importance of chronic control with oral medications. Given no electrolyte disturbances will start on lisinopril here and discharged home. He has follow-up with a new PCP in 4 days. Discussed importance of keeping compliant. Neuro exam benign. No chest symptoms. He is still mildly intoxicated, but the BudaSheriff will take him home.  I personally performed the services described in this documentation, which was scribed in my presence. The recorded information has been reviewed and is accurate.   Final Clinical Impressions(s) / ED Diagnoses   Final diagnoses:  Alcoholic intoxication without complication (HCC)  Essential hypertension    New Prescriptions New Prescriptions   LISINOPRIL (PRINIVIL,ZESTRIL) 20 MG TABLET    Take 1 tablet (20 mg total) by mouth daily.     Pricilla LovelessScott Tessi Eustache, MD 04/27/16 [phone]

## 2016-04-27 NOTE — ED Notes (Signed)
Called patient's wife to see if she could come pick up the patient at discharge.  Wife reports she does not drive after dark and she does not have another option for pt to get home, but states she would try to get a hold of pt's brother.

## 2016-12-10 ENCOUNTER — Encounter (HOSPITAL_COMMUNITY): Payer: Self-pay | Admitting: Adult Health

## 2016-12-10 ENCOUNTER — Encounter (HOSPITAL_COMMUNITY): Payer: Self-pay

## 2016-12-10 ENCOUNTER — Emergency Department (HOSPITAL_COMMUNITY)
Admission: EM | Admit: 2016-12-10 | Discharge: 2016-12-10 | Disposition: A | Discharge status: Home / Self Care | Payer: Self-pay | Attending: Emergency Medicine | Admitting: Emergency Medicine

## 2016-12-10 ENCOUNTER — Emergency Department (HOSPITAL_COMMUNITY)
Admission: EM | Admit: 2016-12-10 | Discharge: 2016-12-11 | Disposition: A | Discharge status: Home / Self Care | Payer: Self-pay | Attending: Emergency Medicine | Admitting: Emergency Medicine

## 2016-12-10 DIAGNOSIS — Z79899 Other long term (current) drug therapy: Secondary | ICD-10-CM | POA: Insufficient documentation

## 2016-12-10 DIAGNOSIS — F17200 Nicotine dependence, unspecified, uncomplicated: Secondary | ICD-10-CM | POA: Insufficient documentation

## 2016-12-10 DIAGNOSIS — I10 Essential (primary) hypertension: Secondary | ICD-10-CM | POA: Insufficient documentation

## 2016-12-10 DIAGNOSIS — F418 Other specified anxiety disorders: Secondary | ICD-10-CM | POA: Insufficient documentation

## 2016-12-10 DIAGNOSIS — F10120 Alcohol abuse with intoxication, uncomplicated: Secondary | ICD-10-CM | POA: Insufficient documentation

## 2016-12-10 DIAGNOSIS — F341 Dysthymic disorder: Secondary | ICD-10-CM

## 2016-12-10 DIAGNOSIS — F1010 Alcohol abuse, uncomplicated: Secondary | ICD-10-CM

## 2016-12-10 HISTORY — DX: Alcohol abuse, uncomplicated: F10.10

## 2016-12-10 LAB — COMPREHENSIVE METABOLIC PANEL
ALK PHOS: 66 U/L (ref 38–126)
ALT: 42 U/L (ref 17–63)
ANION GAP: 13 (ref 5–15)
AST: 44 U/L — ABNORMAL HIGH (ref 15–41)
Albumin: 4.5 g/dL (ref 3.5–5.0)
BUN: 10 mg/dL (ref 6–20)
CALCIUM: 8.6 mg/dL — AB (ref 8.9–10.3)
CO2: 21 mmol/L — ABNORMAL LOW (ref 22–32)
CREATININE: 0.87 mg/dL (ref 0.61–1.24)
Chloride: 104 mmol/L (ref 101–111)
Glucose, Bld: 86 mg/dL (ref 65–99)
Potassium: 3.5 mmol/L (ref 3.5–5.1)
Sodium: 138 mmol/L (ref 135–145)
Total Bilirubin: 0.9 mg/dL (ref 0.3–1.2)
Total Protein: 7.7 g/dL (ref 6.5–8.1)

## 2016-12-10 LAB — RAPID URINE DRUG SCREEN, HOSP PERFORMED
Amphetamines: NOT DETECTED
Barbiturates: NOT DETECTED
Benzodiazepines: POSITIVE — AB
Cocaine: NOT DETECTED
OPIATES: NOT DETECTED
Tetrahydrocannabinol: NOT DETECTED

## 2016-12-10 LAB — CBC WITH DIFFERENTIAL/PLATELET
Basophils Absolute: 0.1 10*3/uL (ref 0.0–0.1)
Basophils Relative: 1 %
Eosinophils Absolute: 0.1 10*3/uL (ref 0.0–0.7)
Eosinophils Relative: 1 %
HEMATOCRIT: 47.1 % (ref 39.0–52.0)
HEMOGLOBIN: 16.8 g/dL (ref 13.0–17.0)
LYMPHS ABS: 2.5 10*3/uL (ref 0.7–4.0)
LYMPHS PCT: 25 %
MCH: 34.3 pg — AB (ref 26.0–34.0)
MCHC: 35.7 g/dL (ref 30.0–36.0)
MCV: 96.1 fL (ref 78.0–100.0)
MONOS PCT: 6 %
Monocytes Absolute: 0.6 10*3/uL (ref 0.1–1.0)
NEUTROS ABS: 6.8 10*3/uL (ref 1.7–7.7)
NEUTROS PCT: 67 %
Platelets: 310 10*3/uL (ref 150–400)
RBC: 4.9 MIL/uL (ref 4.22–5.81)
RDW: 13 % (ref 11.5–15.5)
WBC: 10 10*3/uL (ref 4.0–10.5)

## 2016-12-10 LAB — ETHANOL: ALCOHOL ETHYL (B): 349 mg/dL — AB (ref <5)

## 2016-12-10 MED ORDER — THIAMINE HCL 100 MG/ML IJ SOLN: 100.0000 mg | Freq: Every day | INTRAMUSCULAR | Status: DC

## 2016-12-10 MED ORDER — LORAZEPAM 1 MG PO TABS: 0.0000 mg | ORAL_TABLET | Freq: Two times a day (BID) | ORAL | Status: DC

## 2016-12-10 MED ORDER — LORAZEPAM 1 MG PO TABS
0.0000 mg | ORAL_TABLET | Freq: Four times a day (QID) | ORAL | Status: DC
  Administered 2016-12-10 – 2016-12-11 (×2): 2 mg via ORAL
  Administered 2016-12-11: 1 mg via ORAL
  Administered 2016-12-11: 2 mg via ORAL
  Filled 2016-12-10: qty 1
  Filled 2016-12-10 (×3): qty 2

## 2016-12-10 MED ORDER — LORAZEPAM 2 MG/ML IJ SOLN
0.0000 mg | Freq: Two times a day (BID) | INTRAMUSCULAR | Status: DC
Start: 2016-12-13 — End: 2016-12-11

## 2016-12-10 MED ORDER — LORAZEPAM 2 MG/ML IJ SOLN: 0.0000 mg | Freq: Four times a day (QID) | INTRAMUSCULAR | Status: DC

## 2016-12-10 MED ORDER — SODIUM CHLORIDE 0.9 % IV BOLUS (SEPSIS)
1000.0000 mL | Freq: Once | INTRAVENOUS | Status: AC
  Administered 2016-12-10: 1000 mL via INTRAVENOUS

## 2016-12-10 MED ORDER — VITAMIN B-1 100 MG PO TABS
100.0000 mg | ORAL_TABLET | Freq: Every day | ORAL | Status: DC
  Administered 2016-12-10 – 2016-12-11 (×2): 100 mg via ORAL
  Filled 2016-12-10 (×2): qty 1

## 2016-12-10 MED ORDER — LISINOPRIL 10 MG PO TABS
20.0000 mg | ORAL_TABLET | Freq: Every day | ORAL | Status: DC
  Administered 2016-12-11: 20 mg via ORAL
  Filled 2016-12-10: qty 2

## 2016-12-10 MED ORDER — LORAZEPAM 2 MG/ML IJ SOLN
1.0000 mg | Freq: Once | INTRAMUSCULAR | Status: AC
  Administered 2016-12-10: 1 mg via INTRAVENOUS
  Filled 2016-12-10: qty 1

## 2016-12-10 NOTE — ED Provider Notes (Signed)
[facility] Provider Note   CSN: [id number] Arrival date & time: 12/10/16  1901     History   Chief Complaint Chief Complaint  Patient presents with  . Medical Clearance    HPI John Gay is a 51 y.o. male.  HPI  Patient presents with his wife who assists with the history of present illness. Patient acknowledges a history of anxiety, depression and alcohol dependency. Over the past days the patient's wife has noticed sustained alcohol intake, atypical behavior, the patient getting lost in the woods, and having difficulty at work. Today, the patient was found by neighbors, with substantial amount of alcohol around him. He was minimally interactive, and brought here for evaluation. Patient awakened, and attempted to leave, but IVC papers were executed by his wife, and the patient is now here for evaluation. Patient himself acknowledges a history of substantial alcohol abuse, denies ongoing suicidal ideation, acknowledges interference with his capacity to perform activities of daily living, performed job responsibility, requests assistance with alcohol addiction. The patient's wife states the patient is a danger to himself given his substantial alcohol intake. Patient self denies pain, nausea, weakness. He does acknowledge anxiety.  Past Medical History:  Diagnosis Date  . Alcohol abuse   . Hypertension     There are no active problems to display for this patient.   History reviewed. No pertinent surgical history.     Home Medications    Prior to Admission medications   Medication Sig Start Date End Date Taking? Authorizing Provider  lisinopril (PRINIVIL,ZESTRIL) 20 MG tablet Take 1 tablet (20 mg total) by mouth daily. 04/27/16   Pricilla Loveless, MD    Family History History reviewed. No pertinent family history.  Social History Social History  Substance Use Topics  . Smoking status: Current Every Day Smoker  . Smokeless tobacco: Never Used  .  Alcohol use Yes     Allergies   Patient has no known allergies.   Review of Systems Review of Systems  Constitutional:       Per HPI, otherwise negative  HENT:       Per HPI, otherwise negative  Respiratory:       Per HPI, otherwise negative  Cardiovascular:       Per HPI, otherwise negative  Gastrointestinal: Negative for vomiting.  Endocrine:       Negative aside from HPI  Genitourinary:       Neg aside from HPI   Musculoskeletal:       Per HPI, otherwise negative  Skin: Negative.   Neurological: Negative for syncope.  Psychiatric/Behavioral: Positive for dysphoric mood. Negative for suicidal ideas. The patient is nervous/anxious.      Physical Exam Updated Vital Signs BP (!) 162/92   Pulse (!) 125   Temp 98.5 F (36.9 C) (Oral)   Resp (!) 22   SpO2 94%   Physical Exam  Constitutional: He is oriented to person, place, and time. He appears well-developed. No distress.  HENT:  Head: Normocephalic and atraumatic.  Eyes: Conjunctivae and EOM are normal.  Cardiovascular: Regular rhythm.  Tachycardia present.   Pulmonary/Chest: Effort normal. No stridor. No respiratory distress.  Abdominal: He exhibits no distension.  Musculoskeletal: He exhibits no edema.  Neurological: He is alert and oriented to person, place, and time.  Skin: Skin is warm and dry.  Psychiatric: His mood appears anxious.  Nursing note and vitals reviewed.    ED Treatments / Results  Labs (all labs ordered are listed, but only  abnormal results are displayed) Labs Reviewed  COMPREHENSIVE METABOLIC PANEL  ETHANOL  RAPID URINE DRUG SCREEN, HOSP PERFORMED  CBC WITH DIFFERENTIAL/PLATELET    Patient tachycardic on initial exam, sinus tachycardia, 110, abnormal  Procedures Procedures (including critical care time)  Medications Ordered in ED Medications  sodium chloride 0.9 % bolus 1,000 mL (not administered)  LORazepam (ATIVAN) injection 1 mg (not administered)     Initial  Impression / Assessment and Plan / ED Course  I have reviewed the triage vital signs and the nursing notes.  Pertinent labs & imaging results that were available during my care of the patient were reviewed by me and considered in my medical decision making (see chart for details).  This patient with history of alcohol abuse presents under involuntary commitment due to concern of the patient being a danger to himself. Patient is awake and alert, acknowledges his pathology. Patient is tachycardic, but not in obvious withdrawal here. Patient received IV fluids, Ativan, was started on a CIWA protocol, our behavioral health colleagues have been consulted to assist with the patient's evaluation.   Final Clinical Impressions(s) / ED Diagnoses  Alcohol abuse depression   Gerhard MunchLockwood, Bruk, MD 12/10/16 2041

## 2016-12-10 NOTE — ED Triage Notes (Signed)
Brought in with wife and Sheriff with IVC papers. Per wife , "He needs help with alcohol and pills. He has been drinking his whole life. HE drinks a pint of liquor and then chases it with a 24 case of beer and then falls and gets hurt. He drinks on his job. HE won't get help. He tries to do it himself and he then he drinks beer to try to wean himself but that is not enough to bring him down. HE also has bad social anxiety and gets so freaked out when dealing with other people. He lost his license for DUI. I don't want him to die and he will if he doesn't get help" Pt states, "I want to be happy and healthy." Wife is the only family here and she reports care giver fatigue.

## 2016-12-10 NOTE — ED Notes (Signed)
Pt walked out of hospital and walked off of hospital premises. Dr Adriana Simascook aware

## 2016-12-10 NOTE — ED Notes (Signed)
[facility]

## 2016-12-10 NOTE — ED Notes (Signed)
Pt not in room. Blood noted on sheets and IV in trash can

## 2016-12-10 NOTE — ED Triage Notes (Signed)
Neighbors called EMS due to alcohol intoxication. They were concerned and noticed he had a half gallon of vodka and a 12 pack of beer that was almost gone.  Upon arrival of EMS BP was 230/120. Upon arrival to Hospital it was 170/100. HR 120. CBG 88.

## 2016-12-10 NOTE — ED Notes (Signed)
/  CRITICAL VALUE ALERT  Critical Value:  Alcohol 349  Date & Time Notied:  12/10/16 2111  Provider Notified: Dr. Jeraldine LootsLockwood   Orders Received/Actions taken: Notified MD

## 2016-12-10 NOTE — ED Notes (Signed)
Pt sitting in waiting room and brought back by security

## 2016-12-10 NOTE — BH Assessment (Signed)
Tele Assessment Note   John Gay is an 51 y.o. male IVC by his wife after a neighbor found him unresponsive on the ground due to alcohol abuse.  His wife reports he was found lost in the woods due to alcohol abuse.  Pt denies SI/HI.  Pt does not endorse AVH.  He does admit to excessive alcohol; however, he does not endorse symptoms of depression or anxiety.  [name] sts he does not have a history of hospitalization, does not see a therapist or a psychiatrist.  John Gay he lives at home with his wife and can return home upon discharge.  John Gay sts he has been drinking all his life.  He denies having any stressors that contribute to his drinking.  His wife notes he is depressed and anxious in the IVC; however, John Gay does not endorse either.  John Gay denies having a legal issues or a history of violence. His wife notes he abuses pills, however, John Gay denies abusing pills and explains he is on blood pressure pills.  John Gay presented disheveled in a hospital gown.  His eye contact was fair and his speech coherent.  His thought content was coherent.  His mood was pleasant and his affect was congruent with his mood.  He had partial judgment and appeared to be in denial about his substance abuse.  His impulse fair.  He was oriented to people, place, time, and situation.  John Gay is being recommended for an AM Psyche Eval per John Gay P.A.  Diagnosis: Alcohol Abuse Disorder  Past Medical History:  Past Medical History:  Diagnosis Date  . Alcohol abuse   . Hypertension     History reviewed. No pertinent surgical history.  Family History: History reviewed. No pertinent family history.  Social History:  reports that he has been smoking.  He has never used smokeless tobacco. He reports that he drinks alcohol. He reports that he does not use drugs.  Additional Social History:  Alcohol / Drug Use Pain Medications: see MAR Prescriptions: See MAR Over the Counter: See  MAR Longest period of sobriety (when/how long): 4-5 months 2017 Negative Consequences of Use: Financial, Personal relationships (Pt wife sts he drinks at work; however, the pt appears to be in denial) Substance #2 Name of Substance 2: Alcohol 2 - Age of First Use: 16 2 - Amount (size/oz): 1/5 of Vodka and at least a 12 pk of beers 2 - Frequency: daily 2 - Duration: Entire life 2 - Last Use / Amount: 12/10/2016  CIWA: CIWA-Ar BP: (!) 155/93 Pulse Rate: (!) 107 Nausea and Vomiting: no nausea and no vomiting Tactile Disturbances: none Tremor: two Auditory Disturbances: not present Paroxysmal Sweats: no sweat visible Visual Disturbances: not present Anxiety: moderately anxious, or guarded, so anxiety is inferred Headache, Fullness in Head: moderately severe Agitation: three Orientation and Clouding of Sensorium: oriented and can do serial additions CIWA-Ar Total: 13 COWS:    PATIENT STRENGTHS: (choose at least two) Average or above average intelligence Communication skills Supportive family/friends  Allergies: No Known Allergies  Home Medications:  (Not in a hospital admission)  OB/GYN Status:  No LMP for male patient.  General Assessment Data Location of Assessment: [facility] TTS Assessment: In system Is this a Tele or Face-to-Face Assessment?: Tele Assessment Is this an Initial Assessment or a Re-assessment for this encounter?: Initial Assessment Marital status: Married Living Arrangements: Spouse/significant other Can pt return to current living arrangement?: No Admission Status: Involuntary Is patient capable of signing voluntary admission?: Yes  Referral Source: Self/Family/Friend Insurance type: None  Medical Screening Exam [facility] Walk-in ONLY) Medical Exam completed: Yes  Crisis Care Plan Living Arrangements: Spouse/significant other Legal Guardian: Other: (self) Name of Psychiatrist: None Name of Therapist: None  Education Status Is patient currently in  school?: No Current Grade: no Highest grade of school patient has completed: some college  Risk to self with the past 6 months Suicidal Ideation: No Has patient been a risk to self within the past 6 months prior to admission? : No Suicidal Intent: No Has patient had any suicidal intent within the past 6 months prior to admission? : No Is patient at risk for suicide?: No Suicidal Plan?: No Has patient had any suicidal plan within the past 6 months prior to admission? : No Access to Means: No What has been your use of drugs/alcohol within the last 12 months?: Alcohol Previous Attempts/Gestures: No How many times?: 0 Other Self Harm Risks: Excessive Drinking Triggers for Past Attempts: None known Intentional Self Injurious Behavior: None Family Suicide History: No Recent stressful life event(s):  (Pt denies) Persecutory voices/beliefs?: No Depression: Yes Depression Symptoms: Insomnia, Isolating Substance abuse history and/or treatment for substance abuse?: Yes Suicide prevention information given to non-admitted patients: Not applicable  Risk to Others within the past 6 months Homicidal Ideation: No Does patient have any lifetime risk of violence toward others beyond the six months prior to admission? : No Thoughts of Harm to Others: No Current Homicidal Intent: No Current Homicidal Plan: No Access to Homicidal Means: No Identified Victim: Na History of harm to others?: No Assessment of Violence: None Noted Violent Behavior Description: None reported Does patient have access to weapons?: Yes (Comment) (Pt sts his weapons are stored properly) Criminal Charges Pending?: No Does patient have a court date: No Is patient on probation?: No  Psychosis Hallucinations: None noted Delusions: None noted  Mental Status Report Appearance/Hygiene: Disheveled, In hospital gown Eye Contact: Fair Motor Activity: Unremarkable Speech: Logical/coherent Level of Consciousness:  Alert Mood: Other (Comment), Pleasant (Calm) Affect: Other (Comment) (Calm) Anxiety Level: None Thought Processes: Coherent Judgement: Impaired Orientation: Person, Time, Place, Situation Obsessive Compulsive Thoughts/Behaviors: None  Cognitive Functioning Concentration: Normal Memory: Recent Intact, Remote Impaired IQ: Average Insight: Fair Impulse Control: Fair Appetite: Fair Sleep: No Change Total Hours of Sleep: 5 Vegetative Symptoms: None  ADLScreening [facility] Assessment Services) Patient's cognitive ability adequate to safely complete daily activities?: Yes Patient able to express need for assistance with ADLs?: Yes Independently performs ADLs?: Yes (appropriate for developmental age)  Prior Inpatient Therapy Prior Inpatient Therapy: No Prior Therapy Dates: None Prior Therapy Facilty/Provider(s): none Reason for Treatment: na  Prior Outpatient Therapy Prior Outpatient Therapy: No Prior Therapy Dates: none Prior Therapy Facilty/Provider(s): none Reason for Treatment: na Does patient have an ACCT team?: No Does patient have Intensive In-House Services?  : No Does patient have [facility] services? : No Does patient have P4CC services?: No  ADL Screening (condition at time of admission) Patient's cognitive ability adequate to safely complete daily activities?: Yes Patient able to express need for assistance with ADLs?: Yes Independently performs ADLs?: Yes (appropriate for developmental age)       Abuse/Neglect Assessment (Assessment to be complete while patient is alone) Physical Abuse: Denies Verbal Abuse: Yes, past (Comment) (Pt sts has been through it all) Sexual Abuse: Denies Exploitation of patient/patient's resources: Denies Self-Neglect: Denies Values / Beliefs Cultural Requests During Hospitalization: None Spiritual Requests During Hospitalization: None Consults Spiritual Care Consult Needed: No Social Work Consult Needed:  No Advance Directives (For  Healthcare) Does Patient Have a Medical Advance Directive?: Yes Would patient like information on creating a medical advance directive?: No - Patient declined    Additional Information 1:1 In Past 12 Months?: No CIRT Risk: No Elopement Risk: No Does patient have medical clearance?: Yes     Disposition:  Disposition Initial Assessment Completed for this Encounter: Yes Disposition of Patient: Other dispositions (AM Pyshce Eval per John Gay P.A.)  [facility] 12/10/2016 8:43 PM

## 2016-12-11 DIAGNOSIS — F1010 Alcohol abuse, uncomplicated: Secondary | ICD-10-CM

## 2016-12-11 DIAGNOSIS — F17210 Nicotine dependence, cigarettes, uncomplicated: Secondary | ICD-10-CM

## 2016-12-11 MED ORDER — HYDROXYZINE HCL 25 MG PO TABS
25.0000 mg | ORAL_TABLET | Freq: Once | ORAL | Status: AC
  Administered 2016-12-11: 25 mg via ORAL
  Filled 2016-12-11: qty 1

## 2016-12-11 MED ORDER — ACETAMINOPHEN 325 MG PO TABS
325.0000 mg | ORAL_TABLET | ORAL | Status: DC | PRN
  Administered 2016-12-11 (×3): 325 mg via ORAL
  Filled 2016-12-11 (×3): qty 1

## 2016-12-11 MED ORDER — CLONIDINE HCL 0.1 MG PO TABS
0.1000 mg | ORAL_TABLET | Freq: Two times a day (BID) | ORAL | Status: DC
  Administered 2016-12-11: 0.1 mg via ORAL
  Filled 2016-12-11: qty 1

## 2016-12-11 MED ORDER — LORAZEPAM 1 MG PO TABS: 1.0000 mg | ORAL_TABLET | Freq: Three times a day (TID) | ORAL | 0 refills | Status: AC | PRN

## 2016-12-11 MED ORDER — LEVOTHYROXINE SODIUM 50 MCG PO TABS
200.0000 ug | ORAL_TABLET | Freq: Every day | ORAL | Status: DC
  Administered 2016-12-11: 200 ug via ORAL
  Filled 2016-12-11: qty 4

## 2016-12-11 NOTE — ED Notes (Signed)
IV removed per pt request. Pt taking po fluids and medications.

## 2016-12-11 NOTE — ED Notes (Signed)
Pt eating lunch tray at this time  

## 2016-12-11 NOTE — ED Notes (Signed)
Pt resting with eyes closed, appears to be in no distress. Respirations are even and unlabored. Sitter at bedside.  

## 2016-12-11 NOTE — ED Notes (Signed)
TTS computer placed in room at this time. Pt is calm, pleasant, and cooperative. Denies HA at this time and states he feels "better".

## 2016-12-11 NOTE — Consult Note (Signed)
Telepsych Consultation   Reason for Consult:  Referring Physician: EDP Patient Identification: John Gay MRN:  [id number] Principal Diagnosis: <principal problem not specified> Diagnosis:  There are no active problems to display for this patient.   Total Time spent with patient: 30 minutes  Subjective:   John Gay is a 51 y.o. male patient admitted with Alcohol Abuse Disorder.  HPI: Per the assessment completed by Alycia Patten on 12/10/16: John Gay is an 51 y.o. male IVC by his wife after a neighbor found him unresponsive on the ground due to alcohol abuse.  His wife reports he was found lost in the woods due to alcohol abuse.  Pt denies SI/HI.  Pt does not endorse AVH.  He does admit to excessive alcohol; however, he does not endorse symptoms of depression or anxiety.  [name] sts he does not have a history of hospitalization, does not see a therapist or a psychiatrist.  John Gay he lives at home with his wife and can return home upon discharge.  John Gay sts he has been drinking all his life.  He denies having any stressors that contribute to his drinking.  His wife notes he is depressed and anxious in the IVC; however, John Gay does not endorse either.  John Gay denies having a legal issues or a history of violence. His wife notes he abuses pills, however, Denys denies abusing pills and explains he is on blood pressure pills.  John Gay presented disheveled in a hospital gown.  His eye contact was fair and his speech coherent.  His thought content was coherent.  His mood was pleasant and his affect was congruent with his mood.  He had partial judgment and appeared to be in denial about his substance abuse.  His impulse fair.  He was oriented to people, place, time, and situation.  On Exam: Patient was seen, chart reviewed with treatment team. Patient was sitting up in bed awake, alert and oriented. He reitereated the reason for this hospital admission as  documented above. He stated that indeed he has alcohol problems and needs help. He stated that he has never been through any kind of treatment program and wants to check into one He denies any suicide/homicide ideations and also denies VAH. Patient endorse some depression but unable to identify the source and cause. Patient is willing to to follow through for treatment in an inpatient facility.  Past Psychiatric History: See H&P  Risk to Self: Suicidal Ideation: No Suicidal Intent: No Is patient at risk for suicide?: No Suicidal Plan?: No Access to Means: No What has been your use of drugs/alcohol within the last 12 months?: Alcohol How many times?: 0 Other Self Harm Risks: Excessive Drinking Triggers for Past Attempts: None known Intentional Self Injurious Behavior: None Risk to Others: Homicidal Ideation: No Thoughts of Harm to Others: No Current Homicidal Intent: No Current Homicidal Plan: No Access to Homicidal Means: No Identified Victim: Na History of harm to others?: No Assessment of Violence: None Noted Violent Behavior Description: None reported Does patient have access to weapons?: Yes (Comment) (Pt sts his weapons are stored properly) Criminal Charges Pending?: No Does patient have a court date: No Prior Inpatient Therapy: Prior Inpatient Therapy: No Prior Therapy Dates: None Prior Therapy Facilty/Provider(s): none Reason for Treatment: na Prior Outpatient Therapy: Prior Outpatient Therapy: No Prior Therapy Dates: none Prior Therapy Facilty/Provider(s): none Reason for Treatment: na Does patient have an ACCT team?: No Does patient have Intensive In-House Services?  : No Does  patient have [facility] services? : No Does patient have P4CC services?: No  Past Medical History:  Past Medical History:  Diagnosis Date  . Alcohol abuse   . Hypertension    History reviewed. No pertinent surgical history. Family History: History reviewed. No pertinent family history. Family  Psychiatric  History:   Social History:  History  Alcohol Use  . Yes     History  Drug Use No    Social History   Social History  . Marital status: Married    Spouse name: N/A  . Number of children: N/A  . Years of education: N/A   Social History Main Topics  . Smoking status: Current Every Day Smoker  . Smokeless tobacco: Never Used  . Alcohol use Yes  . Drug use: No  . Sexual activity: Not Asked   Other Topics Concern  . None   Social History Narrative  . None   Additional Social History:    Allergies:  No Known Allergies  Labs:  Results for orders placed or performed during the hospital encounter of 12/10/16 (from the past 48 hour(s))  Comprehensive metabolic panel     Status: Abnormal   Collection Time: 12/10/16  7:16 PM  Result Value Ref Range   Sodium 138 135 - 145 mmol/L   Potassium 3.5 3.5 - 5.1 mmol/L   Chloride 104 101 - 111 mmol/L   CO2 21 (L) 22 - 32 mmol/L   Glucose, Bld 86 65 - 99 mg/dL   BUN 10 6 - 20 mg/dL   Creatinine, Ser 0.87 0.61 - 1.24 mg/dL   Calcium 8.6 (L) 8.9 - 10.3 mg/dL   Total Protein 7.7 6.5 - 8.1 g/dL   Albumin 4.5 3.5 - 5.0 g/dL   AST 44 (H) 15 - 41 U/L   ALT 42 17 - 63 U/L   Alkaline Phosphatase 66 38 - 126 U/L   Total Bilirubin 0.9 0.3 - 1.2 mg/dL   GFR calc non Af Amer >60 >60 mL/min   GFR calc Af Amer >60 >60 mL/min    Comment: (NOTE) The eGFR has been calculated using the CKD EPI equation. This calculation has not been validated in all clinical situations. eGFR's persistently <60 mL/min signify possible Chronic Kidney Disease.    Anion gap 13 5 - 15  Ethanol     Status: Abnormal   Collection Time: 12/10/16  7:16 PM  Result Value Ref Range   Alcohol, Ethyl (B) 349 (HH) <5 mg/dL    Comment:        LOWEST DETECTABLE LIMIT FOR SERUM ALCOHOL IS 5 mg/dL FOR MEDICAL PURPOSES ONLY CRITICAL RESULT CALLED TO, READ BACK BY AND VERIFIED WITH: WILEY,E ON 12/10/16 AT 2110 BY LOY,C   Urine rapid drug screen (hosp performed)      Status: Abnormal   Collection Time: 12/10/16  7:16 PM  Result Value Ref Range   Opiates NONE DETECTED NONE DETECTED   Cocaine NONE DETECTED NONE DETECTED   Benzodiazepines POSITIVE (A) NONE DETECTED   Amphetamines NONE DETECTED NONE DETECTED   Tetrahydrocannabinol NONE DETECTED NONE DETECTED   Barbiturates NONE DETECTED NONE DETECTED    Comment:        DRUG SCREEN FOR MEDICAL PURPOSES ONLY.  IF CONFIRMATION IS NEEDED FOR ANY PURPOSE, NOTIFY LAB WITHIN 5 DAYS.        LOWEST DETECTABLE LIMITS FOR URINE DRUG SCREEN Drug Class       Cutoff (ng/mL) Amphetamine      1000 Barbiturate  200 Benzodiazepine   097 Tricyclics       353 Opiates          300 Cocaine          300 THC              50   CBC with Diff     Status: Abnormal   Collection Time: 12/10/16  7:16 PM  Result Value Ref Range   WBC 10.0 4.0 - 10.5 K/uL   RBC 4.90 4.22 - 5.81 MIL/uL   Hemoglobin 16.8 13.0 - 17.0 g/dL   HCT 47.1 39.0 - 52.0 %   MCV 96.1 78.0 - 100.0 fL   MCH 34.3 (H) 26.0 - 34.0 pg   MCHC 35.7 30.0 - 36.0 g/dL   RDW 13.0 11.5 - 15.5 %   Platelets 310 150 - 400 K/uL   Neutrophils Relative % 67 %   Neutro Abs 6.8 1.7 - 7.7 K/uL   Lymphocytes Relative 25 %   Lymphs Abs 2.5 0.7 - 4.0 K/uL   Monocytes Relative 6 %   Monocytes Absolute 0.6 0.1 - 1.0 K/uL   Eosinophils Relative 1 %   Eosinophils Absolute 0.1 0.0 - 0.7 K/uL   Basophils Relative 1 %   Basophils Absolute 0.1 0.0 - 0.1 K/uL    Current Facility-Administered Medications  Medication Dose Route Frequency Provider Last Rate Last Dose  . acetaminophen (TYLENOL) tablet 325 mg  325 mg Oral Q4H PRN Merryl Hacker, MD   325 mg at 12/11/16 0606  . cloNIDine (CATAPRES) tablet 0.1 mg  0.1 mg Oral BID Milton Ferguson, MD   0.1 mg at 12/11/16 0905  . levothyroxine (SYNTHROID, LEVOTHROID) tablet 200 mcg  200 mcg Oral QAC breakfast Milton Ferguson, MD   200 mcg at 12/11/16 0905  . lisinopril (PRINIVIL,ZESTRIL) tablet 20 mg  20 mg Oral Daily  Carmin Muskrat, MD   20 mg at 12/11/16 0905  . LORazepam (ATIVAN) injection 0-4 mg  0-4 mg Intravenous Q6H Carmin Muskrat, MD       Or  . LORazepam (ATIVAN) tablet 0-4 mg  0-4 mg Oral Q6H Carmin Muskrat, MD   2 mg at 12/11/16 0845  . [START ON 12/13/2016] LORazepam (ATIVAN) injection 0-4 mg  0-4 mg Intravenous Q12H Carmin Muskrat, MD       Or  . Derrill Memo ON 12/13/2016] LORazepam (ATIVAN) tablet 0-4 mg  0-4 mg Oral Q12H Carmin Muskrat, MD      . thiamine (VITAMIN B-1) tablet 100 mg  100 mg Oral Daily Carmin Muskrat, MD   100 mg at 12/11/16 2992   Or  . thiamine (B-1) injection 100 mg  100 mg Intravenous Daily Carmin Muskrat, MD       Current Outpatient Prescriptions  Medication Sig Dispense Refill  . cloNIDine (CATAPRES) 0.1 MG tablet TAKE 1 TABLET BY MOUTH TWICE A DAY FOR BLOOD PRESSURE  5  . levothyroxine (SYNTHROID, LEVOTHROID) 200 MCG tablet Take 200 mcg by mouth daily before breakfast.      Musculoskeletal: UTA via camera  Psychiatric Specialty Exam: Physical Exam  Nursing note and vitals reviewed.   Review of Systems  Psychiatric/Behavioral: Positive for depression and substance abuse (alcohol). Negative for hallucinations, memory loss and suicidal ideas. The patient is not nervous/anxious and does not have insomnia.   All other systems reviewed and are negative.   Blood pressure (!) 168/106, pulse 100, temperature 97.9 F (36.6 C), temperature source Oral, resp. rate 20, SpO2 95 %.There is no  height or weight on file to calculate BMI.  General Appearance: unremarkable, on hospital scrub  Eye Contact:  Good  Speech:  Clear and Coherent and Normal Rate  Volume:  Normal  Mood:  Anxious  Affect:  Congruent  Thought Process:  Coherent and Goal Directed  Orientation:  Full (Time, Place, and Person)  Thought Content:  WDL and Logical  Suicidal Thoughts:  No  Homicidal Thoughts:  No  Memory:  Immediate;   Good Recent;   Good Remote;   Fair  Judgement:  Good   Insight:  Good and Present  Psychomotor Activity:  Normal  Concentration:  Concentration: Good and Attention Span: Good  Recall:  Good  Fund of Knowledge:  Good  Language:  Good  Akathisia:  Negative  Handed:  Right  AIMS (if indicated):     Assets:  Communication Skills Desire for Improvement Financial Resources/Insurance Housing Intimacy Leisure Time Physical Health Resilience  ADL's:  Intact  Cognition:  WNL  Sleep:        Treatment Plan Summary: Patient denies any SI/HI/VAH  Patient to be transferred to inpatient detox facility for treatment SW to seek placement in appropriate facility  Disposition: No evidence of imminent risk to self or others at present.   Patient does not meet criteria for psychiatric inpatient admission. Supportive therapy provided about ongoing stressors. Refer to IOP. Discussed crisis plan, support from social network, calling 911, coming to the Emergency Department, and calling Suicide Hotline.  Vicenta Aly, NP 12/11/2016 12:50 PM

## 2016-12-11 NOTE — ED Provider Notes (Signed)
Patient has been seen and evaluated by our behavioral health team. Patient has had facilitation of placement in a rehabilitation facility. This will occur tomorrow. To ensure that this can occur, the patient requires discharge, reversal IVC papers, which was executed.   Gerhard MunchLockwood, Jonthan, MD 12/11/16 [phone]

## 2016-12-11 NOTE — Discharge Instructions (Signed)
As discussed, it is important that you proceed to our rehabilitation facility tomorrow.  Return here for concerning changes in your condition.

## 2016-12-11 NOTE — ED Notes (Signed)
Pt clothing, wallet, cell phone and cigarettes sent home with pt wife.

## 2016-12-11 NOTE — ED Notes (Addendum)
Assumed care of patient from [place], [place]. Pt resting quietly, No distress. Sleeping. Easily aroused. Pattricia Bossnnie Psychiatrist(sitter) at bedside. Pt is under IVC - paperwork reviewed. Pt calm, cooperative with staff, denies SI/HI at this time.

## 2016-12-11 NOTE — ED Notes (Signed)
Pt snoring while asleep, easily aroused, O2 applied at 2lpm via [place].

## 2016-12-11 NOTE — ED Notes (Signed)
Pt denies SI/HI at this time.  

## 2016-12-11 NOTE — Progress Notes (Signed)
CSW received call from [facility] Intake Nurse, John Gay, who explained that they are considering pt however, he must come with a weeks worth of his Synthroid and Lisinopril.    [facility] Nurse also asked for information on what time pt was IVC'd.  CSW explained that [facility] would be rescinding his IVC.  Intake Nurse acknowledged that they could not take him unless his IVC was rescinded for a minimum of 24 hours..   CSW contacted pt's wife, John Gay, [phone]) to ask her to bring a weeks worth of all medications. Pt's wife explained that he had been taking Clonidine for his blood pressure but that she believed he had a current prescription of Lisinopril as well.  CSW notes that pt has been taking both meds while at the hospital.    CSW explained that pt's wife would have to transport him to [facility].  [place] was still in [place] and needs about 2 hours to return home and get the pt's medications and clothing.  CSW placed a call to [facility] Intake Nurse, John Gay, to confirm that patient would have a weeks worth of all medications.  She explained that [facility] can not take a patient within 24 hours of being under an IVC.  Pt was IVC'd on 12/10/16 at 18:30.  If pt's IVC is rescinded today, [facility] has agreed to hold his bed for his admission at 19:00 on 12/12/16.  CSW contacted pt's wife and told her that pt could not be held in [facility] any longer as he does not meet criteria. CSW explained that pt must be off IVC status for a minimum of 24 hours before he could be admitted to [facility].  CSW also explained that IVC would be rescinded pt's wife would need to pick him up today and that in addition to bringing a weeks worth of meds, pt would need to have a negative BAC, or [facility] would not accept him. Pt's wife asked if he could be given a "small prescription" for Ativan so that he would be able to stay sober for the next 24 hours.  CSW agreed to contact [facility], to ask if that was possible.    CSW called [facility] and spoke to John Gay, who agreed to d/c pt  with a prescription for Lorazepam.  CSW contacted all parties to confirm the following:  *Pt's IVC is to be rescinded by 19:00 *Pt will be d/c'd today with a prescription for Lorazepam *Pt must bring one week of all medications to [facility] *Pt has been accepted at [facility] for admission tomorrow (12/12/16) evening at 19:00 *Pt's wife is to contact [facility] @[phone] to notify if pt relapses prior to admission.  All parties voiced understanding.  John Gay, MSW, LCSWA Clinical Social Work Disposition [phone]

## 2016-12-11 NOTE — ED Notes (Signed)
Patient wanting pain meds for headache and anxiety at this time. Patient is becoming upset and frustrated. RN Dorathy DaftKayla is made aware of patient's wants.

## 2016-12-11 NOTE — BH Assessment (Signed)
Per Ferne ReusJustina Okonkwo, NP, patient does not meet criteria for inpatient treatment. Recommended for D/C.   CSW currently seeking outpatient resources for patient.   CSW spoke with patient's wife, Marlowe ShoresLori Laraia [phone]). Lawson FiscalLori stated "my husband will be dead if you guys d/c him with outpatient. Outpatient doesn't work for him".   CSW acknowledged patient's wife concerns and informed her that the provider was recommending D/C and that CSW was still looking for resources.   CSW sent patient's information to [facility] for possible placement.   CSW will continue to follow for further updates.   Baldo DaubJolan Maridel Pixler MSW, LCSWA CSW Disposition [phone]

## 2016-12-11 NOTE — ED Notes (Signed)
John Gay, wife, [phone] (home), [phone] (cell), [facility]. [facility] notified.

## 2019-06-05 DEATH — deceased
